# Patient Record
Sex: Male | Born: 1995 | Race: Black or African American | Hispanic: No | Marital: Single | Smoking: Never smoker
Health system: Southern US, Community
[De-identification: ages and names within clinical notes are randomized; demographics above are authoritative.]

## PROBLEM LIST (undated history)

## (undated) DIAGNOSIS — Z993 Dependence on wheelchair: Secondary | ICD-10-CM

## (undated) DIAGNOSIS — K116 Mucocele of salivary gland: Secondary | ICD-10-CM

## (undated) DIAGNOSIS — R6250 Unspecified lack of expected normal physiological development in childhood: Secondary | ICD-10-CM

## (undated) DIAGNOSIS — G809 Cerebral palsy, unspecified: Secondary | ICD-10-CM

## (undated) HISTORY — PX: EYE MUSCLE SURGERY: SHX370

## (undated) HISTORY — PX: OTHER SURGICAL HISTORY: SHX169

## (undated) HISTORY — PX: KNEE SURGERY: SHX244

---
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
## 2017-03-08 ENCOUNTER — Ambulatory Visit: Payer: Medicaid Other | Attending: Physical Medicine and Rehabilitation

## 2017-03-08 DIAGNOSIS — R278 Other lack of coordination: Secondary | ICD-10-CM | POA: Insufficient documentation

## 2017-03-08 NOTE — Therapy (Signed)
[place], [place], [address] Phone: [phone]   Fax:  [phone]  Patient Details  Name: Taylor Fernandez MRN: [id number] Date of Birth: 21-Nov-1995 Referring Provider:  Georga Hacking, MD  Encounter Date: 03/08/2017 [name] and his Mom arrived for his screen today and verbalized that they were interested in vocational training. OT and Therapy supervisor discussed with neuro rehab to confirm they could assist with Taylor Fernandez's needs/requests. Neuro rehab agreed that they could work with him to accomplish his goals. Mom and Taylor Fernandez agreed and they worked with front office staff to schedule an evaluation with neuro rehab.  Agustin Cree MS, OTR/L 03/08/2017, 9:48 AM  [place], [place], [address] Phone: [phone]   Fax:  [phone]

## 2017-03-15 ENCOUNTER — Ambulatory Visit: Payer: Medicaid Other | Attending: Physical Medicine and Rehabilitation | Admitting: Occupational Therapy

## 2017-03-15 DIAGNOSIS — G8113 Spastic hemiplegia affecting right nondominant side: Secondary | ICD-10-CM

## 2017-03-15 DIAGNOSIS — R279 Unspecified lack of coordination: Secondary | ICD-10-CM | POA: Insufficient documentation

## 2017-03-15 DIAGNOSIS — G8112 Spastic hemiplegia affecting left dominant side: Secondary | ICD-10-CM | POA: Diagnosis present

## 2017-03-15 DIAGNOSIS — R278 Other lack of coordination: Secondary | ICD-10-CM

## 2017-03-15 NOTE — Therapy (Signed)
[facility] [address] [place], [place], [address] Phone: [phone]   Fax:  [phone]  Occupational Therapy Evaluation  Patient Details  Name: Taylor Fernandez MRN: [id number] Date of Birth: 08-May-1996 Referring Provider: Dr. Georga Hacking  Encounter Date: 03/15/2017      OT End of Session - 03/15/17 1022    Visit Number 1   Number of Visits 17   Date for OT Re-Evaluation 05/14/17   Authorization Type MCD - awaiting authorization   OT Start Time 0850   OT Stop Time 0935   OT Time Calculation (min) 45 min   Activity Tolerance Patient tolerated treatment well      Past Medical History:  Diagnosis Date  . Cerebral palsy ([place])   . Developmental delay   . Wheelchair bound    Can not stand     Past Surgical History:  Procedure Laterality Date  . EYE MUSCLE SURGERY     21 yrs old and 56-12 yrs old  . KNEE SURGERY     Tendons extended on both legs  . STRABISMUS SURGERY Bilateral 04/03/2015   Procedure: REPAIR STRABISMUS PEDIATRIC;  Surgeon: Everitt Amber, MD;  Location: [facility];  Service: Ophthalmology;  Laterality: Bilateral;    There were no vitals filed for this visit.      Subjective Assessment - 03/15/17 0856    Patient is accompained by: Family member  dad   Pertinent History CP (Spastic), tendon lengthening LE's   Limitations bilateral AFO's   Currently in Pain? No/denies           [facility] OT Assessment - 03/15/17 0001      Assessment   Diagnosis Cerebral Palsy  spastic, quadraplegic type   Referring Provider Dr. Georga Hacking   Onset Date --  congenital   Assessment Pt uses w/c for longer distances and requires assist for all transfers and walking. Pt scissors when walking. Rt side spasticity greater than Lt side. Pt cognitively on a low level elementary grade   Prior Therapy pediatric outpatient therapy     Precautions   Precautions Fall  baclofen pump    Required Braces or Orthoses Other Brace/Splint   Other Brace/Splint bilateral AFO's     Balance Screen   Has the patient fallen in the past 6 months No   Has the patient had a decrease in activity level because of a fear of falling?  No   Is the patient reluctant to leave their home because of a fear of falling?  No     Home  Environment   Bathroom Shower/Tub Tub/Shower [employer]  w/c, motorized w/c, platform walker   Additional Comments Pt lives in 1 story home with level entry   Lives With --  parents     Prior Function   Level of Independence Needs assistance with ADLs;Needs assistance with gait   Vocation Student  about to graduate    Comments In Life Skills class d/t lower level cognition     ADL   Eating/Feeding Needs assist with cutting food  set up   Grooming --  dependent   Upper Body Bathing Minimal assistance   Lower Body Bathing Moderate assistance   Upper Body Dressing Maximal assistance   Lower Body Dressing --  dependent   Toilet Transfer Moderate assistance  with assist bilateral hands   Tub/Shower Transfer Maximal assistance  shower seat long enough to perform like w/ tub bench  ADL comments Pt with max difficulty backing up to sit from standing d/t LE spasticity.      IADL   Meal Prep Able to complete simple cold meal and snack prep  from motorized w/c     Mobility   Mobility Status Needs assist   Mobility Status Comments motorized w/c for kitchen mobility and community (to school).      Written Expression   Dominant Hand Left     Vision - History   Additional Comments eye surgery to correct strabismus     Cognition   Overall Cognitive Status History of cognitive impairments - at baseline     Observation/Other Assessments   Observations Pt can follow simple commands, but slower to process. Pt verbal but dysarthric and slow to process info. Pt unable to detect Rt/Lt discrimination     Coordination   Box and  Blocks TBA following session   Coordination Pt has functional use Lt hand with opposition to 5th digit. Unsure if pt has in hand manipulation - TBA further next session. Rt hand only with gross grasp on top of object (vs. around object d/t lack of thumb abduction)     Tone   Assessment Location Right Upper Extremity;Left Upper Extremity     ROM / Strength   AROM / PROM / Strength AROM     AROM   Overall AROM Comments Overall - pt dominated by synergy pattern RUE especially distally. Pt has approx 90* sh. flex, 75% ER, 25% IR, 75% elbow ext. Pt is in wrist extension with just gross grasp and release.  Thumb opposition to 2nd digit only. LUE: Sh. WFL's, elbow full flex, ext limited approx 15%, full gross composite flex/ext of hand, thumb opposition to 5th digit     RUE Tone   RUE Tone Moderate     LUE Tone   LUE Tone Mild                           OT Short Term Goals - 03/15/17 1031      OT SHORT TERM GOAL #1   Title Pt/family independent with initial HEP (STG's 04/14/17)    Baseline Dependent    Time 4   Period Weeks   Status New     OT SHORT TERM GOAL #2   Title Pt/family independent with splint wear and care prn   Baseline dependent   Time 4   Period Weeks   Status New     OT SHORT TERM GOAL #3   Title Pt/family to verbalize understanding with potential A/E and task modifications to increase ease/independence with ADLS   Baseline dependent    Time 4   Period Weeks   Status New     OT SHORT TERM GOAL #4   Title Pt/family to verbalize understanding with recommended muscles to address medically (botox injections) for spasitcity management and function   Baseline dependent   Time 4   Period Weeks   Status New           OT Long Term Goals - 03/15/17 1036      OT LONG TERM GOAL #1   Title Improve Box & BLocks score LUE to increase functional reaching tasks (LTG's due 05/14/17)    Baseline TBA next session   Time 8   Period Weeks   Status New      OT LONG TERM GOAL #2   Title Pt to perform UE dressing with  mod assist or less   Baseline max assist   Time 8   Period Weeks   Status New     OT LONG TERM GOAL #3   Title Pt to provide min assist for LE dressing using bilateral hands   Baseline dependent   Time 8   Period Weeks   Status New     OT LONG TERM GOAL #4   Title Pt/family to have greater understanding of potential job entry possibilities with patient's physical and cognitive deficits   Baseline dependent   Time 8   Period Weeks   Status New               Plan - 03/15/17 1024    Clinical Impression Statement Pt is a 21 y.o. 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 Pt is graduating high school next week and is interested in vocational pursuits if able. Pt/father also concerned with spasticity increasing now that battery from baclofen pump is low, and wants to avoid surgery to replace. Pt/father also interested in resuming botox injections. Pt would benefit from O.T. for proper stretching/HEP to minimize spasticity, potential splinting needs, botox recommendations, and functional use of UE's as able for potential job entry.  (Pt is already submitted application for Voc Rehab)   Occupational Profile and client history currently impacting functional performance CP with physical and cognitive deficits   Occupational performance deficits (Please refer to evaluation for details): ADL's;IADL's;Work;Social Participation   Rehab Potential Good   Current Impairments/barriers affecting progress: severity of physical and cognitive deficits from CP   OT Frequency 2x / week   OT Duration 8 weeks  plus eval   OT Treatment/Interventions Self-care/ADL training;DME and/or AE instruction;Splinting;Patient/family education;Therapeutic exercises;Therapeutic activities;Neuromuscular education;Functional Mobility Training;Passive range of motion;Cognitive  remediation/compensation;Manual Therapy;Electrical Stimulation   Plan Further assess coordination including Box & Blocks both hands if able, in hand manipulation Lt hand, and assess grip strength and writing. Begin splinting for Rt hand (for Rt thumb abduction)   Clinical Decision Making Several treatment options, min-mod task modification necessary      Patient will benefit from skilled therapeutic intervention in order to improve the following deficits and impairments:  Decreased coordination, Impaired flexibility, Improper body mechanics, Improper spinal/pelvic alignment, Decreased safety awareness, Impaired tone, Impaired UE functional use, Decreased mobility, Decreased cognition, Increased muscle spasms, Decreased knowledge of use of DME  Visit Diagnosis: Spastic hemiplegia affecting right nondominant side, unspecified etiology ([place]) - Plan: Ot plan of care cert/re-cert  Spastic hemiplegia affecting left dominant side, unspecified etiology ([place]) - Plan: Ot plan of care cert/re-cert  Other lack of coordination - Plan: Ot plan of care cert/re-cert    Problem List There are no active problems to display for this patient.   Carey Bullocks, OTR/L 03/15/2017, 10:56 AM  [facility] [address] [place], [place], [address] Phone: [phone]   Fax:  [phone]  Name: FREDDIE DYMEK MRN: [id number] Date of Birth: 1996/05/08

## 2017-04-04 ENCOUNTER — Ambulatory Visit: Payer: Medicaid Other | Admitting: Occupational Therapy

## 2017-04-04 DIAGNOSIS — R278 Other lack of coordination: Secondary | ICD-10-CM

## 2017-04-04 DIAGNOSIS — G8113 Spastic hemiplegia affecting right nondominant side: Secondary | ICD-10-CM

## 2017-04-04 NOTE — Therapy (Signed)
[facility] [address] [place], [place], [address] Phone: [phone]   Fax:  [phone]  Occupational Therapy Treatment  Patient Details  Name: Taylor Fernandez MRN: [id number] Date of Birth: 09-09-1996 Referring Provider: Dr. Georga Hacking  Encounter Date: 04/04/2017      OT End of Session - 04/04/17 0910    Visit Number 2   Number of Visits 17   Date for OT Re-Evaluation 05/14/17   Authorization Type MCD   Authorization Time Period approved 04/04/17 - 05/29/17 for 16 visits   Authorization - Visit Number 1   Authorization - Number of Visits 16   OT Start Time 0810   OT Stop Time 0850   OT Time Calculation (min) 40 min   Activity Tolerance Patient tolerated treatment well      Past Medical History:  Diagnosis Date  . Cerebral palsy ([place])   . Developmental delay   . Wheelchair bound    Can not stand     Past Surgical History:  Procedure Laterality Date  . EYE MUSCLE SURGERY     21 yrs old and 18-12 yrs old  . KNEE SURGERY     Tendons extended on both legs  . STRABISMUS SURGERY Bilateral 04/03/2015   Procedure: REPAIR STRABISMUS PEDIATRIC;  Surgeon: Everitt Amber, MD;  Location: [facility];  Service: Ophthalmology;  Laterality: Bilateral;    There were no vitals filed for this visit.      Subjective Assessment - 04/04/17 0814    Subjective  He has an old splint I can bring in next time to show you (per mom)   Patient is accompained by: Family member  mom   Pertinent History CP (Spastic), tendon lengthening LE's   Limitations bilateral AFO's   Currently in Pain? No/denies            [facility] OT Assessment - 04/04/17 0001      Assessment   Assessment Further assessed function and coordination in both hands and writing - see below     Coordination   Box and Blocks Rt = unable, Lt = 13 (slower partly d/t cognitive status)    Other Pt able to print name and simple sentence  with Lt hand but requires extra time, and does not always space b/t words.    Coordination Pt can pick up and rotate highlighter between fingers, and manipulate checkers (up to 3) from fingertips to/from palm Lt hand.      Hand Function   Right Hand Grip (lbs) Avg 40 lbs (but difficult to determine true strength d/t spasticity)   Left Hand Grip (lbs) Avg 50 lbs (40, 60, 50 lbs)   only mild hypertonia Lt hand - does not limit function                  OT Treatments/Exercises (OP) - 04/04/17 0001      ADLs   ADL Comments Further assessed function - see assessment. Also discussed splinting options with fellow O.T. as she will see pt next session     Splinting   Splinting Began thumb abduction splint for Rt hand which places thumb MP joint in better functional position, however IP joint still hyperextends significantly when attempting to grasp item (d/t spasticity), and splint is difficult to don/doff. May benefit better from more dorsal blocking splint for thumb to block IP joint as well and for greater ease with donning/doffing, OR order pre-fab splint (similar to previous splint for  Rt hand - pt to bring in next session)                   OT Short Term Goals - 03/15/17 1031      OT SHORT TERM GOAL #1   Title Pt/family independent with initial HEP (STG's 04/14/17)    Baseline Dependent    Time 4   Period Weeks   Status New     OT SHORT TERM GOAL #2   Title Pt/family independent with splint wear and care prn   Baseline dependent   Time 4   Period Weeks   Status New     OT SHORT TERM GOAL #3   Title Pt/family to verbalize understanding with potential A/E and task modifications to increase ease/independence with ADLS   Baseline dependent    Time 4   Period Weeks   Status New     OT SHORT TERM GOAL #4   Title Pt/family to verbalize understanding with recommended muscles to address medically (botox injections) for spasitcity management and function   Baseline  dependent   Time 4   Period Weeks   Status New           OT Long Term Goals - 04/04/17 0915      OT LONG TERM GOAL #1   Title Improve Box & BLocks score LUE by 3 blocks or greater to increase functional reaching tasks (LTG's due 05/14/17)    Baseline 13   Time 8   Period Weeks   Status Revised     OT LONG TERM GOAL #2   Title Pt to perform UE dressing with mod assist or less   Baseline max assist   Time 8   Period Weeks   Status New     OT LONG TERM GOAL #3   Title Pt to provide min assist for LE dressing using bilateral hands   Baseline dependent   Time 8   Period Weeks   Status New     OT LONG TERM GOAL #4   Title Pt/family to have greater understanding of potential job entry possibilities with patient's physical and cognitive deficits   Baseline dependent   Time 8   Period Weeks   Status New               Plan - 04/04/17 2505    Clinical Impression Statement Further assessed function and coordination both hands: pt able to perform in hand manipulation Lt hand. Pt unable to do Box & Blocks Rt hand.    Rehab Potential Good   OT Treatment/Interventions Self-care/ADL training;DME and/or AE instruction;Splinting;Patient/family education;Therapeutic exercises;Therapeutic activities;Neuromuscular education;Functional Mobility Training;Passive range of motion;Cognitive remediation/compensation;Manual Therapy;Electrical Stimulation   Plan continue to explore splinting options for Rt thumb (for better positioning) - see treatment section for details. If time allows, begin self assisted or P/ROM HEP for RUE as appropriate   Consulted and Agree with Plan of Care Patient      Patient will benefit from skilled therapeutic intervention in order to improve the following deficits and impairments:  Decreased coordination, Impaired flexibility, Improper body mechanics, Improper spinal/pelvic alignment, Decreased safety awareness, Impaired tone, Impaired UE functional use,  Decreased mobility, Decreased cognition, Increased muscle spasms, Decreased knowledge of use of DME  Visit Diagnosis: Spastic hemiplegia affecting right nondominant side, unspecified etiology ([place])  Other lack of coordination    Problem List There are no active problems to display for this patient.   Carey Bullocks, OTR/L 04/04/2017, 9:17  AM  [facility] [address] [place], [place], [address] Phone: [phone]   Fax:  [phone]  Name: Taylor Fernandez MRN: [id number] Date of Birth: Oct 07, 1996

## 2017-04-06 ENCOUNTER — Encounter: Payer: Medicaid Other | Admitting: Occupational Therapy

## 2017-04-07 ENCOUNTER — Ambulatory Visit: Payer: Medicaid Other | Admitting: Occupational Therapy

## 2017-04-07 DIAGNOSIS — G8113 Spastic hemiplegia affecting right nondominant side: Secondary | ICD-10-CM

## 2017-04-07 DIAGNOSIS — R279 Unspecified lack of coordination: Secondary | ICD-10-CM

## 2017-04-07 DIAGNOSIS — R278 Other lack of coordination: Secondary | ICD-10-CM

## 2017-04-07 DIAGNOSIS — G8112 Spastic hemiplegia affecting left dominant side: Secondary | ICD-10-CM

## 2017-04-07 NOTE — Therapy (Signed)
[facility] [address] [place], [place], [address] Phone: [phone]   Fax:  [phone]  Occupational Therapy Treatment  Patient Details  Name: Taylor Fernandez MRN: [id number] Date of Birth: 1995/12/15 Referring Provider: Dr. Georga Hacking  Encounter Date: 04/07/2017      OT End of Session - 04/07/17 1621    Visit Number 3   Number of Visits 17   Date for OT Re-Evaluation 05/14/17   Authorization Type MCD   Authorization Time Period approved 04/04/17 - 05/29/17 for 16 visits   Authorization - Visit Number 2   Authorization - Number of Visits 16   OT Start Time 0850   OT Stop Time 0930   OT Time Calculation (min) 40 min      Past Medical History:  Diagnosis Date  . Cerebral palsy ([place])   . Developmental delay   . Wheelchair bound    Can not stand     Past Surgical History:  Procedure Laterality Date  . EYE MUSCLE SURGERY     21 yrs old and [age] yrs old  . KNEE SURGERY     Tendons extended on both legs  . STRABISMUS SURGERY Bilateral 04/03/2015   Procedure: REPAIR STRABISMUS PEDIATRIC;  Surgeon: Everitt Amber, MD;  Location: [facility];  Service: Ophthalmology;  Laterality: Bilateral;    There were no vitals filed for this visit.      Subjective Assessment - 04/07/17 1631    Patient is accompained by: Family member  mom   Pertinent History CP (Spastic), tendon lengthening LE's   Limitations bilateral AFO's   Currently in Pain? No/denies           Therapist took measurements and assessed prefab splinting needs with pt's mother. Therapistt to hold off on issue of hard custom splint at this time. Functional reach and grasp release with LUE, min v.c Pt was able to grasp release a 1 inch block with RUE with mod faciliation Handwriting activities with pt writing his name and to trace capital letters using foam grip, min v.c. Pt demonstrates good  performance                      OT Short Term Goals - 03/15/17 1031      OT SHORT TERM GOAL #1   Title Pt/family independent with initial HEP (STG's 04/14/17)    Baseline Dependent    Time 4   Period Weeks   Status New     OT SHORT TERM GOAL #2   Title Pt/family independent with splint wear and care prn   Baseline dependent   Time 4   Period Weeks   Status New     OT SHORT TERM GOAL #3   Title Pt/family to verbalize understanding with potential A/E and task modifications to increase ease/independence with ADLS   Baseline dependent    Time 4   Period Weeks   Status New     OT SHORT TERM GOAL #4   Title Pt/family to verbalize understanding with recommended muscles to address medically (botox injections) for spasitcity management and function   Baseline dependent   Time 4   Period Weeks   Status New           OT Long Term Goals - 04/04/17 0915      OT LONG TERM GOAL #1   Title Improve Box & BLocks score LUE by 3 blocks or greater to increase functional reaching  tasks (LTG's due 05/14/17)    Baseline 13   Time 8   Period Weeks   Status Revised     OT LONG TERM GOAL #2   Title Pt to perform UE dressing with mod assist or less   Baseline max assist   Time 8   Period Weeks   Status New     OT LONG TERM GOAL #3   Title Pt to provide min assist for LE dressing using bilateral hands   Baseline dependent   Time 8   Period Weeks   Status New     OT LONG TERM GOAL #4   Title Pt/family to have greater understanding of potential job entry possibilities with patient's physical and cognitive deficits   Baseline dependent   Time 8   Period Weeks   Status New               Plan - 04/07/17 1621    Clinical Impression Statement Therapist discussed splinting options with pt/ and mother. Pt's mother was unable to locate pt's previous prefab splint. Therapsit to order prefab splint for pt trial.   Rehab Potential Good   Current  Impairments/barriers affecting progress: severity of physical and cognitive deficits from CP   OT Frequency 2x / week   OT Duration 8 weeks   OT Treatment/Interventions Self-care/ADL training;DME and/or AE instruction;Splinting;Patient/family education;Therapeutic exercises;Therapeutic activities;Neuromuscular education;Functional Mobility Training;Passive range of motion;Cognitive remediation/compensation;Manual Therapy;Electrical Stimulation   Plan P/ROM HEP for RUE, functional use of UE's   Consulted and Agree with Plan of Care Patient;Family member/caregiver   Family Member Consulted mother      Patient will benefit from skilled therapeutic intervention in order to improve the following deficits and impairments:  Decreased coordination, Impaired flexibility, Improper body mechanics, Improper spinal/pelvic alignment, Decreased safety awareness, Impaired tone, Impaired UE functional use, Decreased mobility, Decreased cognition, Increased muscle spasms, Decreased knowledge of use of DME  Visit Diagnosis: Spastic hemiplegia affecting right nondominant side, unspecified etiology ([facility])  Other lack of coordination  Lack of coordination  Spastic hemiplegia affecting left dominant side, unspecified etiology ([place])    Problem List There are no active problems to display for this patient.   RINE,KATHRYN 04/07/2017, 4:31 PM  [facility] [address] [place], [place], [address] Phone: [phone]   Fax:  [phone]  Name: DESIDERIO DOLATA MRN: [id number] Date of Birth: 05-Nov-1995

## 2017-04-11 ENCOUNTER — Ambulatory Visit: Payer: Medicaid Other | Attending: Physical Medicine and Rehabilitation | Admitting: Occupational Therapy

## 2017-04-11 DIAGNOSIS — G8113 Spastic hemiplegia affecting right nondominant side: Secondary | ICD-10-CM | POA: Insufficient documentation

## 2017-04-11 DIAGNOSIS — G8112 Spastic hemiplegia affecting left dominant side: Secondary | ICD-10-CM | POA: Diagnosis present

## 2017-04-11 DIAGNOSIS — R279 Unspecified lack of coordination: Secondary | ICD-10-CM | POA: Diagnosis present

## 2017-04-11 DIAGNOSIS — R278 Other lack of coordination: Secondary | ICD-10-CM | POA: Insufficient documentation

## 2017-04-11 NOTE — Therapy (Signed)
[facility] [address] [place], [place], [address] Phone: [phone]   Fax:  [phone]  Occupational Therapy Treatment  Patient Details  Name: Taylor Fernandez MRN: [id number] Date of Birth: 1996/06/05 Referring Provider: Dr. Georga Hacking  Encounter Date: 04/11/2017      OT End of Session - 04/11/17 1706    Visit Number 4   Date for OT Re-Evaluation 05/14/17   Authorization Type MCD   Authorization Time Period approved 04/04/17 - 05/29/17 for 16 visits   Authorization - Visit Number 3   Authorization - Number of Visits 16   OT Start Time 0807   OT Stop Time 0845   OT Time Calculation (min) 38 min      Past Medical History:  Diagnosis Date  . Cerebral palsy ([place])   . Developmental delay   . Wheelchair bound    Can not stand     Past Surgical History:  Procedure Laterality Date  . EYE MUSCLE SURGERY     21 yrs old and 19-10 yrs old  . KNEE SURGERY     Tendons extended on both legs  . STRABISMUS SURGERY Bilateral 04/03/2015   Procedure: REPAIR STRABISMUS PEDIATRIC;  Surgeon: Everitt Amber, MD;  Location: [facility];  Service: Ophthalmology;  Laterality: Bilateral;    There were no vitals filed for this visit.      Subjective Assessment - 04/11/17 1707    Subjective  Denies pain   Pertinent History CP (Spastic), tendon lengthening LE's   Limitations bilateral AFO's   Currently in Pain? No/denies             Treatment: Pt transferred stand pivot with 2 person assist,(pt performing 70%) for safety. Prone on elbows over physioball while lifting  chest and performing functional reach with LUE to place checkers in connect four frame, min-mod facilitation(max a +2 to get positioned over physioball) Pt rolled over on his back with mod -max A then he transitioned to seated mod A.                    OT Short Term Goals - 03/15/17 1031      OT SHORT TERM GOAL #1    Title Pt/family independent with initial HEP (STG's 04/14/17)    Baseline Dependent    Time 4   Period Weeks   Status New     OT SHORT TERM GOAL #2   Title Pt/family independent with splint wear and care prn   Baseline dependent   Time 4   Period Weeks   Status New     OT SHORT TERM GOAL #3   Title Pt/family to verbalize understanding with potential A/E and task modifications to increase ease/independence with ADLS   Baseline dependent    Time 4   Period Weeks   Status New     OT SHORT TERM GOAL #4   Title Pt/family to verbalize understanding with recommended muscles to address medically (botox injections) for spasitcity management and function   Baseline dependent   Time 4   Period Weeks   Status New           OT Long Term Goals - 04/04/17 0915      OT LONG TERM GOAL #1   Title Improve Box & BLocks score LUE by 3 blocks or greater to increase functional reaching tasks (LTG's due 05/14/17)    Baseline 13   Time 8   Period Weeks  Status Revised     OT LONG TERM GOAL #2   Title Pt to perform UE dressing with mod assist or less   Baseline max assist   Time 8   Period Weeks   Status New     OT LONG TERM GOAL #3   Title Pt to provide min assist for LE dressing using bilateral hands   Baseline dependent   Time 8   Period Weeks   Status New     OT LONG TERM GOAL #4   Title Pt/family to have greater understanding of potential job entry possibilities with patient's physical and cognitive deficits   Baseline dependent   Time 8   Period Weeks   Status New               Plan - 04/11/17 1706    Clinical Impression Statement Pt is progressing towards goals for UE functional use.   Rehab Potential Good   Current Impairments/barriers affecting progress: severity of physical and cognitive deficits from CP   OT Frequency 2x / week   OT Duration 8 weeks   OT Treatment/Interventions Self-care/ADL training;DME and/or AE instruction;Splinting;Patient/family  education;Therapeutic exercises;Therapeutic activities;Neuromuscular education;Functional Mobility Training;Passive range of motion;Cognitive remediation/compensation;Manual Therapy;Electrical Stimulation   Plan functional use of UE's, spasticity management   Consulted and Agree with Plan of Care Patient;Family member/caregiver   Family Member Consulted mother      Patient will benefit from skilled therapeutic intervention in order to improve the following deficits and impairments:  Decreased coordination, Impaired flexibility, Improper body mechanics, Improper spinal/pelvic alignment, Decreased safety awareness, Impaired tone, Impaired UE functional use, Decreased mobility, Decreased cognition, Increased muscle spasms, Decreased knowledge of use of DME  Visit Diagnosis: Spastic hemiplegia affecting right nondominant side, unspecified etiology (HCC)  Other lack of coordination  Lack of coordination  Spastic hemiplegia affecting left dominant side, unspecified etiology ([place])    Problem List There are no active problems to display for this patient.   Tallin Hart 04/11/2017, 5:13 PM Theone Murdoch, OTR/L [phone] Phone: [phone] 5:13 PM 04/11/17 [facility] [address] [place], [place], [address] Phone: [phone]   Fax:  [phone]  Name: Taylor Fernandez MRN: [id number] Date of Birth: Apr 05, 1996

## 2017-04-13 ENCOUNTER — Ambulatory Visit: Payer: Medicaid Other | Admitting: Occupational Therapy

## 2017-04-13 ENCOUNTER — Encounter: Payer: Self-pay | Admitting: Occupational Therapy

## 2017-04-13 DIAGNOSIS — R278 Other lack of coordination: Secondary | ICD-10-CM

## 2017-04-13 DIAGNOSIS — G8112 Spastic hemiplegia affecting left dominant side: Secondary | ICD-10-CM

## 2017-04-13 DIAGNOSIS — G8113 Spastic hemiplegia affecting right nondominant side: Secondary | ICD-10-CM | POA: Diagnosis not present

## 2017-04-13 NOTE — Therapy (Signed)
[facility] [address] [place], [place], [address] Phone: [phone]   Fax:  [phone]  Occupational Therapy Treatment  Patient Details  Name: Taylor Fernandez MRN: [id number] Date of Birth: 08-20-1996 Referring Provider: Dr. Georga Hacking  Encounter Date: 04/13/2017      OT End of Session - 04/13/17 1002    Visit Number 5   Number of Visits 17   Date for OT Re-Evaluation 05/14/17   Authorization Type MCD   Authorization Time Period approved 04/04/17 - 05/29/17 for 16 visits   Authorization - Visit Number 5   Authorization - Number of Visits 16   OT Start Time [phone]  pt arrived late   OT Stop Time 0932   OT Time Calculation (min) 40 min      Past Medical History:  Diagnosis Date  . Cerebral palsy ([place])   . Developmental delay   . Wheelchair bound    Can not stand     Past Surgical History:  Procedure Laterality Date  . EYE MUSCLE SURGERY     21 yrs old and 10-10 yrs old  . KNEE SURGERY     Tendons extended on both legs  . STRABISMUS SURGERY Bilateral 04/03/2015   Procedure: REPAIR STRABISMUS PEDIATRIC;  Surgeon: Everitt Amber, MD;  Location: [facility];  Service: Ophthalmology;  Laterality: Bilateral;    There were no vitals filed for this visit.      Subjective Assessment - 04/13/17 0854    Subjective  I like to do exercises    Patient is accompained by: Family member  mom   Pertinent History CP (Spastic), tendon lengthening LE's   Limitations bilateral AFO's   Currently in Pain? No/denies                      OT Treatments/Exercises (OP) - 04/13/17 0001      Neurological Re-education Exercises   Other Exercises 1 Neuro re ed to address development of HEP - discussed with mom positioning on back with bolster under LE's to reduce reflexive response and facilitate lower tone.  Demonstrated LE flexion with lower trunk intiiated slow rotation to decrease tone  prior to addressing UE's.  Pt then able to use bar to do chest presses and then overhead reach with min a for RUE.  Pt completed 15 reps x2 for each exercise.  Progressed to open chain reaching for small ball in PNF and overhead patterns with each UE separately.  Mom needed to leave duirng session therefore will instruct mom next session for HEP.  Mom in agreement. Mom also requesting info on how to obtain bolster, bench for kneeling and elongaged therapy ball. Will provide resource next session.                    OT Short Term Goals - 04/13/17 1001      OT SHORT TERM GOAL #1   Title Pt/family independent with initial HEP (STG's 04/14/17)    Baseline Dependent    Time 4   Period Weeks   Status New     OT SHORT TERM GOAL #2   Title Pt/family independent with splint wear and care prn   Baseline dependent   Time 4   Period Weeks   Status New     OT SHORT TERM GOAL #3   Title Pt/family to verbalize understanding with potential A/E and task modifications to increase ease/independence with ADLS   Baseline  dependent    Time 4   Period Weeks   Status New     OT SHORT TERM GOAL #4   Title Pt/family to verbalize understanding with recommended muscles to address medically (botox injections) for spasitcity management and function   Baseline dependent   Time 4   Period Weeks   Status New           OT Long Term Goals - 04/13/17 1001      OT LONG TERM GOAL #1   Title Improve Box & BLocks score LUE by 3 blocks or greater to increase functional reaching tasks (LTG's due 05/14/17)    Baseline 13   Time 8   Period Weeks   Status Revised     OT LONG TERM GOAL #2   Title Pt to perform UE dressing with mod assist or less   Baseline max assist   Time 8   Period Weeks   Status New     OT LONG TERM GOAL #3   Title Pt to provide min assist for LE dressing using bilateral hands   Baseline dependent   Time 8   Period Weeks   Status New     OT LONG TERM GOAL #4   Title  Pt/family to have greater understanding of potential job entry possibilities with patient's physical and cognitive deficits   Baseline dependent   Time 8   Period Weeks   Status New               Plan - 04/13/17 1001    Clinical Impression Statement Pt progressing toward goals. Will instruct mom in active HEP next session.    Rehab Potential Good   Current Impairments/barriers affecting progress: severity of physical and cognitive deficits from CP   OT Frequency 2x / week   OT Duration 8 weeks   OT Treatment/Interventions Self-care/ADL training;DME and/or AE instruction;Splinting;Patient/family education;Therapeutic exercises;Therapeutic activities;Neuromuscular education;Functional Mobility Training;Passive range of motion;Cognitive remediation/compensation;Manual Therapy;Printmaker   Plan instruct mom in HEP, provide resource for equipment, functional use of UE's, spasticity mgmt.   Consulted and Agree with Plan of Care Patient;Family member/caregiver   Family Member Consulted mother part of session      Patient will benefit from skilled therapeutic intervention in order to improve the following deficits and impairments:  Decreased coordination, Impaired flexibility, Improper body mechanics, Improper spinal/pelvic alignment, Decreased safety awareness, Impaired tone, Impaired UE functional use, Decreased mobility, Decreased cognition, Increased muscle spasms, Decreased knowledge of use of DME  Visit Diagnosis: Spastic hemiplegia affecting right nondominant side, unspecified etiology (HCC)  Other lack of coordination  Spastic hemiplegia affecting left dominant side, unspecified etiology ([place])    Problem List There are no active problems to display for this patient.   Quay Burow, OTR/L 04/13/2017, 10:04 AM  [facility] [address] [place], [place], [address] Phone: [phone]   Fax:   [phone]  Name: Taylor Fernandez MRN: [id number] Date of Birth: September 28, 1996

## 2017-04-18 ENCOUNTER — Ambulatory Visit: Payer: Medicaid Other | Admitting: Occupational Therapy

## 2017-04-20 ENCOUNTER — Ambulatory Visit: Payer: Medicaid Other | Admitting: Occupational Therapy

## 2017-04-20 DIAGNOSIS — G8112 Spastic hemiplegia affecting left dominant side: Secondary | ICD-10-CM

## 2017-04-20 DIAGNOSIS — G8113 Spastic hemiplegia affecting right nondominant side: Secondary | ICD-10-CM | POA: Diagnosis not present

## 2017-04-20 NOTE — Therapy (Signed)
[facility] [address] [place], [place], [address] Phone: [phone]   Fax:  [phone]  Occupational Therapy Treatment  Patient Details  Name: Taylor Fernandez MRN: [id number] Date of Birth: June 22, 1996 Referring Provider: Dr. Georga Hacking  Encounter Date: 04/20/2017      OT End of Session - 04/20/17 1129    Visit Number 6   Number of Visits 17   Date for OT Re-Evaluation 05/14/17   Authorization Type MCD   Authorization Time Period approved 04/04/17 - 05/29/17 for 16 visits   Authorization - Visit Number 6   Authorization - Number of Visits 16   OT Start Time 0805   OT Stop Time 0850   OT Time Calculation (min) 45 min   Activity Tolerance Patient tolerated treatment well      Past Medical History:  Diagnosis Date  . Cerebral palsy ([place])   . Developmental delay   . Wheelchair bound    Can not stand     Past Surgical History:  Procedure Laterality Date  . EYE MUSCLE SURGERY     21 yrs old and 52-12 yrs old  . KNEE SURGERY     Tendons extended on both legs  . STRABISMUS SURGERY Bilateral 04/03/2015   Procedure: REPAIR STRABISMUS PEDIATRIC;  Surgeon: Everitt Amber, MD;  Location: [facility];  Service: Ophthalmology;  Laterality: Bilateral;    There were no vitals filed for this visit.      Subjective Assessment - 04/20/17 0817    Subjective  I'd rather you just work with him now (per mom - vs. getting pictures of requested items)    Patient is accompained by: Family member  mom   Pertinent History CP (Spastic), tendon lengthening LE's   Limitations bilateral AFO's   Currently in Pain? No/denies                      OT Treatments/Exercises (OP) - 04/20/17 0001      ADLs   ADL Comments Mom did not want therapist to make copies of requested items from previous session during today's session. She wished to start him on an ex program     Neurological Re-education  Exercises   Other Exercises 1 Supine: with bolster under LE's to reduce reflexive response and decr. tone LE's: began with stretches/trunk rotation prior to ex's: Pt then performed chest press, sh. flex/ext overhead reach, and sh. abd/add with slight elbow extension stretch as able using dowel. Will formally issue HEP next session d/t time constraints (mother was only present at beginning of session for splint assessment, and last 5 min. of session to review HEP)     Splinting   Splinting Assessed 2 different pre-fab splints for thumb abduction Rt hand as order came in: pt preferred one over the other (TK thumb splint) and was easier to don/doff therefore issued. Will keep other pre-fab on hold if pt/mother wish to have as well.                 OT Education - 04/20/17 1129    Education provided Yes   Education Details HEP   Person(s) Educated Patient;Parent(s)   Methods Explanation;Demonstration;Verbal cues  HANDOUT to be provided next session d/t time constraints   Comprehension Verbalized understanding;Returned demonstration;Need further instruction;Verbal cues required          OT Short Term Goals - 04/20/17 1130      OT SHORT TERM GOAL #1  Title Pt/family independent with initial HEP (STG's 04/14/17)    Baseline Dependent    Time 4   Period Weeks   Status On-going     OT SHORT TERM GOAL #2   Title Pt/family independent with splint wear and care prn   Baseline dependent   Time 4   Period Weeks   Status On-going     OT SHORT TERM GOAL #3   Title Pt/family to verbalize understanding with potential A/E and task modifications to increase ease/independence with ADLS   Baseline dependent    Time 4   Period Weeks   Status New     OT SHORT TERM GOAL #4   Title Pt/family to verbalize understanding with recommended muscles to address medically (botox injections) for spasitcity management and function   Baseline dependent   Time 4   Period Weeks   Status New            OT Long Term Goals - 04/13/17 1001      OT LONG TERM GOAL #1   Title Improve Box & BLocks score LUE by 3 blocks or greater to increase functional reaching tasks (LTG's due 05/14/17)    Baseline 13   Time 8   Period Weeks   Status Revised     OT LONG TERM GOAL #2   Title Pt to perform UE dressing with mod assist or less   Baseline max assist   Time 8   Period Weeks   Status New     OT LONG TERM GOAL #3   Title Pt to provide min assist for LE dressing using bilateral hands   Baseline dependent   Time 8   Period Weeks   Status New     OT LONG TERM GOAL #4   Title Pt/family to have greater understanding of potential job entry possibilities with patient's physical and cognitive deficits   Baseline dependent   Time 8   Period Weeks   Status New               Plan - 04/20/17 1132    Clinical Impression Statement Pt progressing towards STG's. Pt did well with pre-fab thumb abduction splint today   Rehab Potential Good   Current Impairments/barriers affecting progress: severity of physical and cognitive deficits from CP   OT Frequency 2x / week   OT Duration 8 weeks   OT Treatment/Interventions Self-care/ADL training;DME and/or AE instruction;Splinting;Patient/family education;Therapeutic exercises;Therapeutic activities;Neuromuscular education;Functional Mobility Training;Passive range of motion;Cognitive remediation/compensation;Manual Therapy;Electrical Stimulation   Plan formally issue and review HEP, issue handouts on requested equipment, functional use of UE's, spasticity mngmt   Consulted and Agree with Plan of Care Patient;Family member/caregiver   Family Member Consulted mother part of session      Patient will benefit from skilled therapeutic intervention in order to improve the following deficits and impairments:  Decreased coordination, Impaired flexibility, Improper body mechanics, Improper spinal/pelvic alignment, Decreased safety awareness, Impaired tone,  Impaired UE functional use, Decreased mobility, Decreased cognition, Increased muscle spasms, Decreased knowledge of use of DME  Visit Diagnosis: Spastic hemiplegia affecting right nondominant side, unspecified etiology (HCC)  Spastic hemiplegia affecting left dominant side, unspecified etiology ([place])    Problem List There are no active problems to display for this patient.   Carey Bullocks, OTR/L 04/20/2017, 11:35 AM  [facility] [address] [place], [place], [address] Phone: [phone]   Fax:  [phone]  Name: Taylor Fernandez MRN: [id number] Date  of Birth: 04/07/1996

## 2017-04-20 NOTE — Patient Instructions (Signed)
  BEGIN BY GENTLY STRETCHING Ja[place] WITH UPPER TRUNK ROTATION AND REACHING ACROSS BODY WITH ARM (EACH SIDE), WHILE KEEPING HIPS FROM ROTATING WITH BODY.   1. ELBOW: Extension / Chest Press (Frame)    Lie on back with knees bent under pillows or bolster. Straighten elbows to raise stick/dowel. Hold _2__ seconds.  _10-15__ reps per set, 2 sets per day   2. ROM: Flexion - Wand (Supine)    Lie on back holding wand. Raise arms over head.  Repeat __10-15__ times per set. Do _1___ sets per session. Do __2__ sessions per day.    3. Cane Horizontal - Supine    With straight arms holding stick/dowel above shoulders, bring out to right, center, out to left, and back to above head. Place pillow under Rt upper arm for support and to decrease spasticity Repeat _10-15__ times. Do _2__ times per day.

## 2017-04-25 ENCOUNTER — Ambulatory Visit: Payer: Medicaid Other | Admitting: Occupational Therapy

## 2017-04-25 DIAGNOSIS — G8113 Spastic hemiplegia affecting right nondominant side: Secondary | ICD-10-CM | POA: Diagnosis not present

## 2017-04-25 DIAGNOSIS — G8112 Spastic hemiplegia affecting left dominant side: Secondary | ICD-10-CM

## 2017-04-25 DIAGNOSIS — R278 Other lack of coordination: Secondary | ICD-10-CM

## 2017-04-25 NOTE — Therapy (Signed)
[facility] [address] [place], [place], [address] Phone: [phone]   Fax:  [phone]  Occupational Therapy Treatment  Patient Details  Name: Taylor Fernandez MRN: [id number] Date of Birth: November 17, 1995 Referring Provider: Dr. Georga Hacking  Encounter Date: 04/25/2017      OT End of Session - 04/25/17 1455    Visit Number 7   Number of Visits 17   Date for OT Re-Evaluation 05/14/17   Authorization Type MCD   Authorization Time Period approved 04/04/17 - 05/29/17 for 16 visits   Authorization - Visit Number 7   Authorization - Number of Visits 16   OT Start Time 0805   OT Stop Time 0850   OT Time Calculation (min) 45 min   Activity Tolerance Patient tolerated treatment well      Past Medical History:  Diagnosis Date  . Cerebral palsy ([place])   . Developmental delay   . Wheelchair bound    Can not stand     Past Surgical History:  Procedure Laterality Date  . EYE MUSCLE SURGERY     21 yrs old and 60-10 yrs old  . KNEE SURGERY     Tendons extended on both legs  . STRABISMUS SURGERY Bilateral 04/03/2015   Procedure: REPAIR STRABISMUS PEDIATRIC;  Surgeon: Everitt Amber, MD;  Location: [facility];  Service: Ophthalmology;  Laterality: Bilateral;    There were no vitals filed for this visit.      Subjective Assessment - 04/25/17 1449    Subjective  I think the velcro converters may work for him   Patient is accompained by: Family member  MOM   Pertinent History CP (Spastic), tendon lengthening LE's   Limitations bilateral AFO's   Currently in Pain? No/denies                      OT Treatments/Exercises (OP) - 04/25/17 0001      ADLs   ADL Comments Began assessing STG's and addressing potential A/E needs and/or task modifications for greater ease with ADLS. Explored rocker knife and options for shoes including velcro converters. No other A/E needs identified that pt  could use. Pt already has modified cup and plate for eating. Mother reports he has no trouble using eating utensils. Also pursuing electric toothbrush for more thoroughness. Also issued handouts and discussed how/where to acquire requested treatment items (bolster, kaye bench, peanut physioball) if mother wishes to pursue further.      Neurological Re-education Exercises   Other Exercises 1 Reviewed and formally issued HEP for BUE's                  OT Short Term Goals - 04/25/17 1456      OT SHORT TERM GOAL #1   Title Pt/family independent with initial HEP (STG's 04/14/17)    Baseline Dependent    Time 4   Period Weeks   Status Achieved     OT SHORT TERM GOAL #2   Title Pt/family independent with splint wear and care prn   Baseline dependent   Time 4   Period Weeks   Status Achieved     OT SHORT TERM GOAL #3   Title Pt/family to verbalize understanding with potential A/E and task modifications to increase ease/independence with ADLS   Baseline dependent    Time 4   Period Weeks   Status On-going     OT SHORT TERM GOAL #4   Title Pt/family  to verbalize understanding with recommended muscles to address medically (botox injections) for spasitcity management and function   Baseline dependent   Time 4   Period Weeks   Status Achieved           OT Long Term Goals - 04/25/17 1457      OT LONG TERM GOAL #1   Title Improve Box & BLocks score LUE by 3 blocks or greater to increase functional reaching tasks (LTG's due 05/14/17)    Baseline 13   Time 8   Period Weeks   Status Revised     OT LONG TERM GOAL #2   Title Pt to perform UE dressing with mod assist or less   Baseline max assist   Time 8   Period Weeks   Status New     OT LONG TERM GOAL #3   Title Pt to provide min assist for LE dressing using bilateral hands   Baseline dependent   Time 8   Period Weeks   Status New     OT LONG TERM GOAL #4   Title Pt/family to have greater understanding of  potential job entry possibilities with patient's physical and cognitive deficits   Baseline dependent   Time 8   Period Weeks   Status Deferred  Mother reports pt not a candidate for Voc Rehab at this time               Plan - 04/25/17 1458    Clinical Impression Statement Pt met 3/4 STG's and approximating remaining goal.    Rehab Potential Good   Current Impairments/barriers affecting progress: severity of physical and cognitive deficits from CP   OT Frequency 2x / week   OT Duration 8 weeks   OT Treatment/Interventions Self-care/ADL training;DME and/or AE instruction;Splinting;Patient/family education;Therapeutic exercises;Therapeutic activities;Neuromuscular education;Functional Mobility Training;Passive range of motion;Cognitive remediation/compensation;Manual Therapy;Technical sales engineer use of rocker knife, practice hooking/unhooking velcro (in prep for potential use of velcro converters), functional use of LUE   Consulted and Agree with Plan of Care Patient;Family member/caregiver   Family Member Consulted mother      Patient will benefit from skilled therapeutic intervention in order to improve the following deficits and impairments:  Decreased coordination, Impaired flexibility, Improper body mechanics, Improper spinal/pelvic alignment, Decreased safety awareness, Impaired tone, Impaired UE functional use, Decreased mobility, Decreased cognition, Increased muscle spasms, Decreased knowledge of use of DME  Visit Diagnosis: Spastic hemiplegia affecting right nondominant side, unspecified etiology (HCC)  Spastic hemiplegia affecting left dominant side, unspecified etiology ([place])  Other lack of coordination    Problem List There are no active problems to display for this patient.   Carey Bullocks, OTR/L 04/25/2017, 3:00 PM  [facility] [address] [place], [place],  [address] Phone: [phone]   Fax:  [phone]  Name: Taylor Fernandez MRN: [id number] Date of Birth: [birth year]

## 2017-04-27 ENCOUNTER — Ambulatory Visit: Payer: Medicaid Other | Admitting: Occupational Therapy

## 2017-04-27 DIAGNOSIS — G8113 Spastic hemiplegia affecting right nondominant side: Secondary | ICD-10-CM

## 2017-04-27 DIAGNOSIS — G8112 Spastic hemiplegia affecting left dominant side: Secondary | ICD-10-CM

## 2017-04-27 DIAGNOSIS — R278 Other lack of coordination: Secondary | ICD-10-CM

## 2017-04-27 NOTE — Therapy (Signed)
[facility] [address] [place], [place], [address] Phone: [phone]   Fax:  [phone]  Occupational Therapy Treatment  Patient Details  Name: Taylor Fernandez MRN: [id number] Date of Birth: [birth year] Referring Provider: Dr. Georga Hacking  Encounter Date: 04/27/2017      OT End of Session - 04/27/17 0952    Visit Number 8   Number of Visits 17   Date for OT Re-Evaluation 05/14/17   Authorization Type MCD   Authorization Time Period approved 04/04/17 - 05/29/17 for 16 visits   Authorization - Visit Number 8   Authorization - Number of Visits 16   OT Start Time 0810  pt arrived 10 min. late   OT Stop Time 0845   OT Time Calculation (min) 35 min   Activity Tolerance Patient tolerated treatment well      Past Medical History:  Diagnosis Date  . Cerebral palsy ([place])   . Developmental delay   . Wheelchair bound    Can not stand     Past Surgical History:  Procedure Laterality Date  . EYE MUSCLE SURGERY     21 yrs old and 29-12 yrs old  . KNEE SURGERY     Tendons extended on both legs  . STRABISMUS SURGERY Bilateral 04/03/2015   Procedure: REPAIR STRABISMUS PEDIATRIC;  Surgeon: Everitt Amber, MD;  Location: [facility];  Service: Ophthalmology;  Laterality: Bilateral;    There were no vitals filed for this visit.      Subjective Assessment - 04/27/17 0810    Patient is accompained by: Family member  mom   Pertinent History CP (Spastic), tendon lengthening LE's   Limitations bilateral AFO's   Currently in Pain? No/denies                      OT Treatments/Exercises (OP) - 04/27/17 0001      ADLs   Eating Practiced cutting with different handled rocker knives. Pt preferred short handled rocker knife with more control Lt hand. Pt able to simulate cutting "food" with mod cueing to rock vs. saw type motion   LB Dressing Pt able to simulate separating velcro in prep for  velcro converters on shoes.    ADL Comments Pt/mother given handouts on rocker knife and velcro converters and instructed in how/where to purchase     Fine Motor Coordination   Fine Motor Coordination Large Pegboard   Large Pegboard Pt placing large pegs in pegboard LUE initially on tabletop (first 2 rows) with little to no difficulty. Then progressed to performing on vertical surface to work on functional mid level reaching and trunk control (to come up off w/c) with coordination. Pt with mod difficulty and extra time, but could do successfully.                   OT Short Term Goals - 04/27/17 0954      OT SHORT TERM GOAL #1   Title Pt/family independent with initial HEP (STG's 04/14/17)    Baseline Dependent    Time 4   Period Weeks   Status Achieved     OT SHORT TERM GOAL #2   Title Pt/family independent with splint wear and care prn   Baseline dependent   Time 4   Period Weeks   Status Achieved     OT SHORT TERM GOAL #3   Title Pt/family to verbalize understanding with potential A/E and task modifications to increase ease/independence  with ADLS   Baseline dependent    Time 4   Period Weeks   Status Achieved     OT SHORT TERM GOAL #4   Title Pt/family to verbalize understanding with recommended muscles to address medically (botox injections) for spasitcity management and function   Baseline dependent   Time 4   Period Weeks   Status Achieved           OT Long Term Goals - 04/25/17 1457      OT LONG TERM GOAL #1   Title Improve Box & BLocks score LUE by 3 blocks or greater to increase functional reaching tasks (LTG's due 05/14/17)    Baseline 13   Time 8   Period Weeks   Status Revised     OT LONG TERM GOAL #2   Title Pt to perform UE dressing with mod assist or less   Baseline max assist   Time 8   Period Weeks   Status New     OT LONG TERM GOAL #3   Title Pt to provide min assist for LE dressing using bilateral hands   Baseline dependent   Time  8   Period Weeks   Status New     OT LONG TERM GOAL #4   Title Pt/family to have greater understanding of potential job entry possibilities with patient's physical and cognitive deficits   Baseline dependent   Time 8   Period Weeks   Status Deferred  Mother reports pt not a candidate for Voc Rehab at this time               Plan - 04/27/17 0954    Clinical Impression Statement Pt has now met all STG's. Progressing towards remaining goals.    Rehab Potential Good   Current Impairments/barriers affecting progress: severity of physical and cognitive deficits from CP   OT Frequency 2x / week   OT Duration 8 weeks   OT Treatment/Interventions Self-care/ADL training;DME and/or AE instruction;Splinting;Patient/family education;Therapeutic exercises;Therapeutic activities;Neuromuscular education;Functional Mobility Training;Passive range of motion;Cognitive remediation/compensation;Manual Therapy;Electrical Stimulation   Plan continue functional use of LUE, and removing large pegs with RUE if able (with splint on), begin practice of UE dressing      Patient will benefit from skilled therapeutic intervention in order to improve the following deficits and impairments:  Decreased coordination, Impaired flexibility, Improper body mechanics, Improper spinal/pelvic alignment, Decreased safety awareness, Impaired tone, Impaired UE functional use, Decreased mobility, Decreased cognition, Increased muscle spasms, Decreased knowledge of use of DME  Visit Diagnosis: Spastic hemiplegia affecting left dominant side, unspecified etiology (HCC)  Spastic hemiplegia affecting right nondominant side, unspecified etiology ([place])  Other lack of coordination    Problem List There are no active problems to display for this patient.   Carey Bullocks, OTR/L 04/27/2017, 9:56 AM  [facility] [address] [place], [place],  [address] Phone: [phone]   Fax:  [phone]  Name: Taylor Fernandez MRN: [id number] Date of Birth: [birth year]

## 2017-05-02 ENCOUNTER — Ambulatory Visit: Payer: Medicaid Other | Admitting: Occupational Therapy

## 2017-05-02 DIAGNOSIS — M24521 Contracture, right elbow: Secondary | ICD-10-CM | POA: Insufficient documentation

## 2017-05-04 ENCOUNTER — Ambulatory Visit: Payer: Medicaid Other | Admitting: Occupational Therapy

## 2017-05-04 DIAGNOSIS — G8113 Spastic hemiplegia affecting right nondominant side: Secondary | ICD-10-CM | POA: Diagnosis not present

## 2017-05-04 DIAGNOSIS — G8112 Spastic hemiplegia affecting left dominant side: Secondary | ICD-10-CM

## 2017-05-04 NOTE — Therapy (Signed)
[facility] [address] [place], [place], [address] Phone: [phone]   Fax:  [phone]  Occupational Therapy Treatment  Patient Details  Name: Taylor Fernandez MRN: [id number] Date of Birth: [birth year] Referring Provider: Dr. Georga Hacking  Encounter Date: 05/04/2017      OT End of Session - 05/04/17 1008    Visit Number 9   Number of Visits 17   Date for OT Re-Evaluation 05/29/17   Authorization Type MCD   Authorization Time Period approved 04/04/17 - 05/29/17 for 16 visits   Authorization - Visit Number 9   Authorization - Number of Visits 16   OT Start Time 0800   OT Stop Time 0845   OT Time Calculation (min) 45 min   Activity Tolerance Patient tolerated treatment well      Past Medical History:  Diagnosis Date  . Cerebral palsy ([place])   . Developmental delay   . Wheelchair bound    Can not stand     Past Surgical History:  Procedure Laterality Date  . EYE MUSCLE SURGERY     21 yrs old and 40-10 yrs old  . KNEE SURGERY     Tendons extended on both legs  . STRABISMUS SURGERY Bilateral 04/03/2015   Procedure: REPAIR STRABISMUS PEDIATRIC;  Surgeon: Everitt Amber, MD;  Location: [facility];  Service: Ophthalmology;  Laterality: Bilateral;    There were no vitals filed for this visit.      Subjective Assessment - 05/04/17 0805    Patient is accompained by: Family member  mom   Pertinent History CP (Spastic), tendon lengthening LE's   Limitations bilateral AFO's   Currently in Pain? No/denies                      OT Treatments/Exercises (OP) - 05/04/17 0001      ADLs   ADL Comments Pt's mother discussed with therapist recent MD visit - MD did not recommend botox at this time, but did order a dynamic splint for Rt elbow. Pt's mother also had questions about d/c from O.T. on targeted end date. Therapist explained that we will likely go until 05/29/17, but she was  concerned about d/c at that time. Explained to pt/mother that all functional goals will be addressed by that time, and no further functional changes would be anticipated, but if things were to change in the future, they could always resume therapy at that time.      Functional Reaching Activities   Mid Level LUE mid level reaching to place large pegs in pegboard to work on functional reaching, trunk control, Lt coordination as able. Attempted to remove pegs with Rt hand with pegboard on table, pt with max difficulty and mod assist secondary to spasitcity and inability to control graded pressure of hand.      Splinting   Splinting Placed Oval 8 on thumb IP joint of Rt hand to prevent hyperextension in an attempt to make grasping pegs from pegboard easier on tabletop (as mother did not bring in previously issued thumb splint). Oval 8 did prevent hyperextension, but only minimally helped with functional grasping d/t spasticity at wrist and other fingers as well. Nevertheless, pt's mother wanted to keep Oval 8 splint to practice with.                   OT Short Term Goals - 04/27/17 0954      OT SHORT TERM GOAL #1  Title Pt/family independent with initial HEP (STG's 04/14/17)    Baseline Dependent    Time 4   Period Weeks   Status Achieved     OT SHORT TERM GOAL #2   Title Pt/family independent with splint wear and care prn   Baseline dependent   Time 4   Period Weeks   Status Achieved     OT SHORT TERM GOAL #3   Title Pt/family to verbalize understanding with potential A/E and task modifications to increase ease/independence with ADLS   Baseline dependent    Time 4   Period Weeks   Status Achieved     OT SHORT TERM GOAL #4   Title Pt/family to verbalize understanding with recommended muscles to address medically (botox injections) for spasitcity management and function   Baseline dependent   Time 4   Period Weeks   Status Achieved           OT Long Term Goals -  04/25/17 1457      OT LONG TERM GOAL #1   Title Improve Box & BLocks score LUE by 3 blocks or greater to increase functional reaching tasks (LTG's due 05/14/17)    Baseline 13   Time 8   Period Weeks   Status Revised     OT LONG TERM GOAL #2   Title Pt to perform UE dressing with mod assist or less   Baseline max assist   Time 8   Period Weeks   Status New     OT LONG TERM GOAL #3   Title Pt to provide min assist for LE dressing using bilateral hands   Baseline dependent   Time 8   Period Weeks   Status New     OT LONG TERM GOAL #4   Title Pt/family to have greater understanding of potential job entry possibilities with patient's physical and cognitive deficits   Baseline dependent   Time 8   Period Weeks   Status Deferred  Mother reports pt not a candidate for Voc Rehab at this time               Plan - 05/04/17 1009    Clinical Impression Statement Pt improving with LUE functional reaching   Rehab Potential Good   Current Impairments/barriers affecting progress: severity of physical and cognitive deficits from CP   OT Frequency 2x / week   OT Duration 8 weeks   OT Treatment/Interventions Self-care/ADL training;DME and/or AE instruction;Splinting;Patient/family education;Therapeutic exercises;Therapeutic activities;Neuromuscular education;Functional Mobility Training;Passive range of motion;Cognitive remediation/compensation;Manual Therapy;Electrical Stimulation   Plan provided functional reaching and coordination activities pt could perform at home for LUE, begin practice of UE dressing   Consulted and Agree with Plan of Care Patient      Patient will benefit from skilled therapeutic intervention in order to improve the following deficits and impairments:  Decreased coordination, Impaired flexibility, Improper body mechanics, Improper spinal/pelvic alignment, Decreased safety awareness, Impaired tone, Impaired UE functional use, Decreased mobility, Decreased  cognition, Increased muscle spasms, Decreased knowledge of use of DME  Visit Diagnosis: Spastic hemiplegia affecting left dominant side, unspecified etiology (HCC)  Spastic hemiplegia affecting right nondominant side, unspecified etiology ([place])    Problem List There are no active problems to display for this patient.   Carey Bullocks, OTR/L 05/04/2017, 10:11 AM  [facility] [address] [place], [place], [address] Phone: [phone]   Fax:  [phone]  Name: Taylor Fernandez MRN: [id number] Date of Birth:  09/23/1996 

## 2017-05-09 ENCOUNTER — Ambulatory Visit: Payer: Medicaid Other | Admitting: Occupational Therapy

## 2017-05-10 ENCOUNTER — Ambulatory Visit: Payer: Medicaid Other | Attending: Physical Medicine and Rehabilitation | Admitting: Occupational Therapy

## 2017-05-10 DIAGNOSIS — G8113 Spastic hemiplegia affecting right nondominant side: Secondary | ICD-10-CM | POA: Insufficient documentation

## 2017-05-10 DIAGNOSIS — R278 Other lack of coordination: Secondary | ICD-10-CM | POA: Insufficient documentation

## 2017-05-10 DIAGNOSIS — G8112 Spastic hemiplegia affecting left dominant side: Secondary | ICD-10-CM | POA: Insufficient documentation

## 2017-05-10 NOTE — Therapy (Signed)
[facility] [address] [place], [place], [address] Phone: [phone]   Fax:  [phone]  Occupational Therapy Treatment  Patient Details  Name: Taylor Fernandez MRN: [id number] Date of Birth: 07-01-1996 Referring Provider: Dr. Georga Hacking  Encounter Date: 05/10/2017      OT End of Session - 05/10/17 1255    Visit Number 10   Number of Visits 17   Date for OT Re-Evaluation 05/29/17   Authorization Type MCD   Authorization Time Period approved 04/04/17 - 05/29/17 for 16 visits   Authorization - Visit Number 10   Authorization - Number of Visits 16   OT Start Time 0851   OT Stop Time 0930   OT Time Calculation (min) 39 min   Activity Tolerance Patient tolerated treatment well      Past Medical History:  Diagnosis Date  . Cerebral palsy ([place])   . Developmental delay   . Wheelchair bound    Can not stand     Past Surgical History:  Procedure Laterality Date  . EYE MUSCLE SURGERY     21 yrs old and 19-12 yrs old  . KNEE SURGERY     Tendons extended on both legs  . STRABISMUS SURGERY Bilateral 04/03/2015   Procedure: REPAIR STRABISMUS PEDIATRIC;  Surgeon: Everitt Amber, MD;  Location: [facility];  Service: Ophthalmology;  Laterality: Bilateral;    There were no vitals filed for this visit.      Subjective Assessment - 05/10/17 0922    Pertinent History CP (Spastic), tendon lengthening LE's   Limitations bilateral AFO's   Currently in Pain? No/denies          Treatment: seated at table activities to increase bilateral LUE functional use and reach, including placing connect four checkers in frame, placing and removing large pegs from pegboard, flipping cards to play war.  Pt used primarily his LUE, however with prefab splint in place pt is able to position items in RUE as an assist.                      OT Short Term Goals - 04/27/17 0954      OT SHORT TERM  GOAL #1   Title Pt/family independent with initial HEP (STG's 04/14/17)    Baseline Dependent    Time 4   Period Weeks   Status Achieved     OT SHORT TERM GOAL #2   Title Pt/family independent with splint wear and care prn   Baseline dependent   Time 4   Period Weeks   Status Achieved     OT SHORT TERM GOAL #3   Title Pt/family to verbalize understanding with potential A/E and task modifications to increase ease/independence with ADLS   Baseline dependent    Time 4   Period Weeks   Status Achieved     OT SHORT TERM GOAL #4   Title Pt/family to verbalize understanding with recommended muscles to address medically (botox injections) for spasitcity management and function   Baseline dependent   Time 4   Period Weeks   Status Achieved           OT Long Term Goals - 04/25/17 1457      OT LONG TERM GOAL #1   Title Improve Box & BLocks score LUE by 3 blocks or greater to increase functional reaching tasks (LTG's due 05/14/17)    Baseline 13   Time 8   Period Weeks  Status Revised     OT LONG TERM GOAL #2   Title Pt to perform UE dressing with mod assist or less   Baseline max assist   Time 8   Period Weeks   Status New     OT LONG TERM GOAL #3   Title Pt to provide min assist for LE dressing using bilateral hands   Baseline dependent   Time 8   Period Weeks   Status New     OT LONG TERM GOAL #4   Title Pt/family to have greater understanding of potential job entry possibilities with patient's physical and cognitive deficits   Baseline dependent   Time 8   Period Weeks   Status Deferred  Mother reports pt not a candidate for Voc Rehab at this time               Plan - 05/10/17 1258    Clinical Impression Statement Pt is progressing towards goals with improving bilateral functional reaching.   Rehab Potential Good   Current Impairments/barriers affecting progress: severity of physical and cognitive deficits from CP   OT Frequency 2x / week   OT  Duration 8 weeks   OT Treatment/Interventions Self-care/ADL training;DME and/or AE instruction;Splinting;Patient/family education;Therapeutic exercises;Therapeutic activities;Neuromuscular education;Functional Mobility Training;Passive range of motion;Cognitive remediation/compensation;Manual Therapy;Electrical Stimulation   Plan continue to work towards goals.   Consulted and Agree with Plan of Care Patient      Patient will benefit from skilled therapeutic intervention in order to improve the following deficits and impairments:  Decreased coordination, Impaired flexibility, Improper body mechanics, Improper spinal/pelvic alignment, Decreased safety awareness, Impaired tone, Impaired UE functional use, Decreased mobility, Decreased cognition, Increased muscle spasms, Decreased knowledge of use of DME  Visit Diagnosis: Spastic hemiplegia affecting left dominant side, unspecified etiology (HCC)  Spastic hemiplegia affecting right nondominant side, unspecified etiology ([place])  Other lack of coordination    Problem List There are no active problems to display for this patient.   RINE,KATHRYN 05/10/2017, 12:59 PM  [facility] [address] [place], [place], [address] Phone: [phone]   Fax:  [phone]  Name: Taylor Fernandez MRN: [id number] Date of Birth: 04/28/1996

## 2017-05-11 ENCOUNTER — Ambulatory Visit: Payer: Medicaid Other | Admitting: Occupational Therapy

## 2017-05-11 DIAGNOSIS — R278 Other lack of coordination: Secondary | ICD-10-CM

## 2017-05-11 DIAGNOSIS — G8113 Spastic hemiplegia affecting right nondominant side: Secondary | ICD-10-CM

## 2017-05-11 DIAGNOSIS — G8112 Spastic hemiplegia affecting left dominant side: Secondary | ICD-10-CM | POA: Diagnosis not present

## 2017-05-11 NOTE — Therapy (Signed)
[facility] [address] [place], [place], [address] Phone: [phone]   Fax:  [phone]  Occupational Therapy Treatment  Patient Details  Name: ABDULKARIM Fernandez MRN: [id number] Date of Birth: 16-Nov-1995 Referring Provider: Dr. Georga Hacking  Encounter Date: 05/11/2017      OT End of Session - 05/11/17 1148    Visit Number 11   Number of Visits 17   Date for OT Re-Evaluation 05/29/17   Authorization Type MCD   Authorization Time Period approved 04/04/17 - 05/29/17 for 16 visits   Authorization - Visit Number 11   Authorization - Number of Visits 16   OT Start Time 0845   OT Stop Time 0930   OT Time Calculation (min) 45 min   Activity Tolerance Patient tolerated treatment well      Past Medical History:  Diagnosis Date  . Cerebral palsy ([place])   . Developmental delay   . Wheelchair bound    Can not stand     Past Surgical History:  Procedure Laterality Date  . EYE MUSCLE SURGERY     21 yrs old and 58-12 yrs old  . KNEE SURGERY     Tendons extended on both legs  . STRABISMUS SURGERY Bilateral 04/03/2015   Procedure: REPAIR STRABISMUS PEDIATRIC;  Surgeon: Everitt Amber, MD;  Location: [facility];  Service: Ophthalmology;  Laterality: Bilateral;    There were no vitals filed for this visit.      Subjective Assessment - 05/11/17 1141    Subjective  I used to sort hangers by colors/sizes   Patient is accompained by: Family member  caregiver, dad present at end of session for education/review   Pertinent History CP (Spastic), tendon lengthening LE's   Limitations bilateral AFO's   Currently in Pain? No/denies                      OT Treatments/Exercises (OP) - 05/11/17 0001      ADLs   UB Dressing Pt doffed shirt with mod cueing but physically able to do I'ly. Pt donned shirt with mod cueing and only min assist to pull shirt down in back    LB Dressing Reaching down  for simulation of hooking/unhooking velcro on shoes with foot propped on stool - pt could do with LLE with only close sup when bending over d/t spasms. However, required min assist to prop Rt LE on stool and CGA/min assist for safety when bending over d/t spasms. Recommended to caregiver (and later with father) that pt practice coming up off w/c at trunk (seated) and bending over with close sup/CGA for trunk control and prep for LE dressing   ADL Comments Discussed dressing strategies with caregiver and possible use of reacher for hooking/unhooking velcro on shoes (once mother orders velcro converters)     Fine Motor Coordination   Other Fine Motor Exercises Pt issued coordination and functional reaching activities to perform with LUE - see pt instructions for details                OT Education - 05/11/17 0903    Education provided Yes   Education Details Coordination and reaching HEP for LUE, verbal educ. re: strategies and A/E needs for dressing   Person(s) Educated Patient;Caregiver(s)   Methods Explanation;Demonstration;Handout   Comprehension Verbalized understanding;Returned demonstration          OT Short Term Goals - 04/27/17 0954      OT SHORT TERM  GOAL #1   Title Pt/family independent with initial HEP (STG's 04/14/17)    Baseline Dependent    Time 4   Period Weeks   Status Achieved     OT SHORT TERM GOAL #2   Title Pt/family independent with splint wear and care prn   Baseline dependent   Time 4   Period Weeks   Status Achieved     OT SHORT TERM GOAL #3   Title Pt/family to verbalize understanding with potential A/E and task modifications to increase ease/independence with ADLS   Baseline dependent    Time 4   Period Weeks   Status Achieved     OT SHORT TERM GOAL #4   Title Pt/family to verbalize understanding with recommended muscles to address medically (botox injections) for spasitcity management and function   Baseline dependent   Time 4   Period  Weeks   Status Achieved           OT Long Term Goals - 05/11/17 1149      OT LONG TERM GOAL #1   Title Improve Box & BLocks score LUE by 3 blocks or greater to increase functional reaching tasks (LTG's due 05/14/17)    Baseline 13   Time 8   Period Weeks   Status Revised     OT LONG TERM GOAL #2   Title Pt to perform UE dressing with mod assist or less   Baseline max assist   Time 8   Period Weeks   Status Achieved  min assist with cueing     OT LONG TERM GOAL #3   Title Pt to provide min assist for LE dressing using bilateral hands   Baseline dependent   Time 8   Period Weeks   Status On-going     OT LONG TERM GOAL #4   Title Pt/family to have greater understanding of potential job entry possibilities with patient's physical and cognitive deficits   Baseline dependent   Time 8   Period Weeks   Status Deferred  Mother reports pt not a candidate for Voc Rehab at this time               Plan - 05/11/17 1150    Clinical Impression Statement Pt progressing towards greater independence with dressing and ADLS. Pt limited by spasms   Rehab Potential Good   Current Impairments/barriers affecting progress: severity of physical and cognitive deficits from CP   OT Frequency 2x / week   OT Duration 8 weeks   OT Treatment/Interventions Self-care/ADL training;DME and/or AE instruction;Splinting;Patient/family education;Therapeutic exercises;Therapeutic activities;Neuromuscular education;Functional Mobility Training;Passive range of motion;Cognitive remediation/compensation;Manual Therapy;Electrical Stimulation   Plan review HEP and dressing strategies with family, review recommendation for trunk control and prep for LE dressing, practice propelling w/c if seatbelt available, if time - dynamic sitting EOB for trunk control    Consulted and Agree with Plan of Care Patient;Family member/caregiver   Family Member Consulted caregiver, spoke with dad at end of session       Patient will benefit from skilled therapeutic intervention in order to improve the following deficits and impairments:  Decreased coordination, Impaired flexibility, Improper body mechanics, Improper spinal/pelvic alignment, Decreased safety awareness, Impaired tone, Impaired UE functional use, Decreased mobility, Decreased cognition, Increased muscle spasms, Decreased knowledge of use of DME  Visit Diagnosis: Spastic hemiplegia affecting left dominant side, unspecified etiology (HCC)  Spastic hemiplegia affecting right nondominant side, unspecified etiology ([place])  Other lack of coordination    Problem List There  are no active problems to display for this patient.   Carey Bullocks, OTR/L 05/11/2017, 11:53 AM  [facility] [address] [place], [place], [address] Phone: [phone]   Fax:  [phone]  Name: Taylor Fernandez MRN: [id number] Date of Birth: [birth year]

## 2017-05-11 NOTE — Patient Instructions (Signed)
  Coordination Activities  Perform the following activities for 15 minutes 2 times per day with left hand(s).   Flip cards 1 at a time as fast as you can. Then you can sort cards by suites or color  Deal cards with your thumb (Hold deck in hand and push card off top with thumb).  Pick up coins and stack (pennies).  Pick up coins (nickels or quarters) one at a time until you get 5 in your hand, then move coins from palm to fingertips to stack one at a time.  Play Connect 4 or just pick up checkers and put into slots - make sure it is high enough to work trunk control too (can place a couple thick books underneath Connect 4

## 2017-05-15 ENCOUNTER — Ambulatory Visit: Payer: Medicaid Other | Admitting: Occupational Therapy

## 2017-05-15 DIAGNOSIS — G8112 Spastic hemiplegia affecting left dominant side: Secondary | ICD-10-CM

## 2017-05-15 DIAGNOSIS — G8113 Spastic hemiplegia affecting right nondominant side: Secondary | ICD-10-CM

## 2017-05-15 NOTE — Therapy (Signed)
[facility] [address] [place], [place], [address] Phone: [phone]   Fax:  [phone]  Occupational Therapy Treatment  Patient Details  Name: Taylor Fernandez MRN: [id number] Date of Birth: 1996-08-18 Referring Provider: Dr. Georga Hacking  Encounter Date: 05/15/2017      OT End of Session - 05/15/17 1156    Visit Number 12   Number of Visits 17   Date for OT Re-Evaluation 05/29/17   Authorization Type MCD   Authorization Time Period approved 04/04/17 - 05/29/17 for 16 visits   Authorization - Visit Number 12   Authorization - Number of Visits 16   OT Start Time 0810  pt arrived 10 min. late   OT Stop Time 0845   OT Time Calculation (min) 35 min   Activity Tolerance Patient tolerated treatment well      Past Medical History:  Diagnosis Date  . Cerebral palsy ([place])   . Developmental delay   . Wheelchair bound    Can not stand     Past Surgical History:  Procedure Laterality Date  . EYE MUSCLE SURGERY     21 yrs old and 37-12 yrs old  . KNEE SURGERY     Tendons extended on both legs  . STRABISMUS SURGERY Bilateral 04/03/2015   Procedure: REPAIR STRABISMUS PEDIATRIC;  Surgeon: Everitt Amber, MD;  Location: [facility];  Service: Ophthalmology;  Laterality: Bilateral;    There were no vitals filed for this visit.      Subjective Assessment - 05/15/17 0810    Subjective  Denies pain   Patient is accompained by: Family member  mom   Pertinent History CP (Spastic), tendon lengthening LE's   Limitations bilateral AFO's   Currently in Pain? No/denies                      OT Treatments/Exercises (OP) - 05/15/17 0001      ADLs   UB Dressing Pt practiced UE dressing with only min assist to pull down in back and mod cueing when donning, min cueing only to doff (however mother did not return until end of session and therefore just verbally reviewed strategies with her)   LB Dressing Reaching down to touch shoes while feet propped on stool in prep for LE dressing and trunk control with close supervision   ADL Comments Attempted to self propel w/c but pt does not have enough control in LUE and LLE to perform. Pt reports he has a motorized w/c at home     Fine Motor Coordination   Other Fine Motor Exercises Reviewed functional coordination HEP with patient and showed mom at end of session in hand manipulation ex with coins     Functional Reaching Activities   Mid Level Pt performing LUE reaching to place pennies in cones and counting 10 for each cone with min cueing needed to keep pt equally distributing pennies. Pt would become distracted and forget which number he was on                  OT Short Term Goals - 04/27/17 0954      OT SHORT TERM GOAL #1   Title Pt/family independent with initial HEP (STG's 04/14/17)    Baseline Dependent    Time 4   Period Weeks   Status Achieved     OT SHORT TERM GOAL #2   Title Pt/family independent with splint wear and care prn  Baseline dependent   Time 4   Period Weeks   Status Achieved     OT SHORT TERM GOAL #3   Title Pt/family to verbalize understanding with potential A/E and task modifications to increase ease/independence with ADLS   Baseline dependent    Time 4   Period Weeks   Status Achieved     OT SHORT TERM GOAL #4   Title Pt/family to verbalize understanding with recommended muscles to address medically (botox injections) for spasitcity management and function   Baseline dependent   Time 4   Period Weeks   Status Achieved           OT Long Term Goals - 05/11/17 1149      OT LONG TERM GOAL #1   Title Improve Box & BLocks score LUE by 3 blocks or greater to increase functional reaching tasks (LTG's due 05/14/17)    Baseline 13   Time 8   Period Weeks   Status Revised     OT LONG TERM GOAL #2   Title Pt to perform UE dressing with mod assist or less   Baseline max assist   Time  8   Period Weeks   Status Achieved  min assist with cueing     OT LONG TERM GOAL #3   Title Pt to provide min assist for LE dressing using bilateral hands   Baseline dependent   Time 8   Period Weeks   Status On-going     OT LONG TERM GOAL #4   Title Pt/family to have greater understanding of potential job entry possibilities with patient's physical and cognitive deficits   Baseline dependent   Time 8   Period Weeks   Status Deferred  Mother reports pt not a candidate for Voc Rehab at this time               Plan - 05/15/17 1156    Clinical Impression Statement Pt progressing with functional use of LUE.    Rehab Potential Good   Current Impairments/barriers affecting progress: severity of physical and cognitive deficits from CP   OT Frequency 2x / week   OT Duration 8 weeks   OT Treatment/Interventions Self-care/ADL training;DME and/or AE instruction;Splinting;Patient/family education;Therapeutic exercises;Therapeutic activities;Neuromuscular education;Functional Mobility Training;Passive range of motion;Cognitive remediation/compensation;Manual Therapy;Electrical Stimulation   Plan review recommendations for trunk control in prep for LE dressing, dynamic sitting EOB for trunk control      Patient will benefit from skilled therapeutic intervention in order to improve the following deficits and impairments:  Decreased coordination, Impaired flexibility, Improper body mechanics, Improper spinal/pelvic alignment, Decreased safety awareness, Impaired tone, Impaired UE functional use, Decreased mobility, Decreased cognition, Increased muscle spasms, Decreased knowledge of use of DME  Visit Diagnosis: Spastic hemiplegia affecting left dominant side, unspecified etiology (HCC)  Spastic hemiplegia affecting right nondominant side, unspecified etiology ([place])    Problem List There are no active problems to display for this patient.   Carey Bullocks, OTR/L 05/15/2017,  11:58 AM  [facility] [address] [place], [place], [address] Phone: [phone]   Fax:  [phone]  Name: DANYL DEEMS MRN: [id number] Date of Birth: [birth year]

## 2017-05-16 ENCOUNTER — Ambulatory Visit: Payer: Medicaid Other | Admitting: Occupational Therapy

## 2017-05-16 DIAGNOSIS — G8113 Spastic hemiplegia affecting right nondominant side: Secondary | ICD-10-CM

## 2017-05-16 DIAGNOSIS — G8112 Spastic hemiplegia affecting left dominant side: Secondary | ICD-10-CM

## 2017-05-16 DIAGNOSIS — R278 Other lack of coordination: Secondary | ICD-10-CM

## 2017-05-16 NOTE — Therapy (Signed)
[facility] [address] [place], [place], [address] Phone: [phone]   Fax:  [phone]  Occupational Therapy Treatment  Patient Details  Name: Taylor Fernandez MRN: [id number] Date of Birth: January 15, 1996 Referring Provider: Dr. Georga Hacking  Encounter Date: 05/16/2017      OT End of Session - 05/16/17 0954    Visit Number 13   Number of Visits 17   Date for OT Re-Evaluation 05/29/17   Authorization Type MCD   Authorization Time Period approved 04/04/17 - 05/29/17 for 16 visits   Authorization - Visit Number 42   Authorization - Number of Visits 16   OT Start Time 0800   OT Stop Time 0845   OT Time Calculation (min) 45 min   Activity Tolerance Patient tolerated treatment well      Past Medical History:  Diagnosis Date  . Cerebral palsy ([place])   . Developmental delay   . Wheelchair bound    Can not stand     Past Surgical History:  Procedure Laterality Date  . EYE MUSCLE SURGERY     21 yrs old and 52-12 yrs old  . KNEE SURGERY     Tendons extended on both legs  . STRABISMUS SURGERY Bilateral 04/03/2015   Procedure: REPAIR STRABISMUS PEDIATRIC;  Surgeon: Everitt Amber, MD;  Location: [facility];  Service: Ophthalmology;  Laterality: Bilateral;    There were no vitals filed for this visit.      Subjective Assessment - 05/16/17 0947    Subjective  Mother reports they will keep pt's baclofen pump in and get a lower dose baclofen hopefully in December   Patient is accompained by: Family member  MOTHER   Pertinent History CP (Spastic), tendon lengthening LE's   Limitations bilateral AFO's   Currently in Pain? No/denies                      OT Treatments/Exercises (OP) - 05/16/17 0001      Neurological Re-education Exercises   Other Exercises 1 Seated EOB (mat) for dynamic tasks for trunk control: pt reaching down to touch shoes alternating UE's, leaning side to side  over elbows with therapist support for lateral trunk activation, and going from semi-inclined position to upright position for core control. Pt required CGA to min assist d/t occasional uncontrolled spasms. Pt very hesistant d/t fear of losing sitting balance     Functional Reaching Activities   Mid Level LUE mid level reaching sitting unsupported EOB (with CGA from therapist for safety) with mod difficulty placing large pegs in pegboard vertical surface LUE d/t trunk unsupported. (When patient performs same task in w/c, pt has little to no difficulty)      Encouraged pt/mother to have pt sit up off w/c backing t/o day for trunk/core activation with supervision             OT Short Term Goals - 04/27/17 0954      OT [place] #1   Title Pt/family independent with initial HEP (STG's 04/14/17)    Baseline Dependent    Time 4   Period Weeks   Status Achieved     OT SHORT TERM GOAL #2   Title Pt/family independent with splint wear and care prn   Baseline dependent   Time 4   Period Weeks   Status Achieved     OT SHORT TERM GOAL #3   Title Pt/family to verbalize understanding with potential A/E  and task modifications to increase ease/independence with ADLS   Baseline dependent    Time 4   Period Weeks   Status Achieved     OT SHORT TERM GOAL #4   Title Pt/family to verbalize understanding with recommended muscles to address medically (botox injections) for spasitcity management and function   Baseline dependent   Time 4   Period Weeks   Status Achieved           OT Long Term Goals - 05/11/17 1149      OT LONG TERM GOAL #1   Title Improve Box & BLocks score LUE by 3 blocks or greater to increase functional reaching tasks (LTG's due 05/14/17)    Baseline 13   Time 8   Period Weeks   Status Revised     OT LONG TERM GOAL #2   Title Pt to perform UE dressing with mod assist or less   Baseline max assist   Time 8   Period Weeks   Status Achieved  min assist  with cueing     OT LONG TERM GOAL #3   Title Pt to provide min assist for LE dressing using bilateral hands   Baseline dependent   Time 8   Period Weeks   Status On-going     OT LONG TERM GOAL #4   Title Pt/family to have greater understanding of potential job entry possibilities with patient's physical and cognitive deficits   Baseline dependent   Time 8   Period Weeks   Status Deferred  Mother reports pt not a candidate for Voc Rehab at this time               Plan - 05/16/17 0954    Clinical Impression Statement Pt limited by spasticity and uncontrollable spasms. Pt reaching max rehab potential at this time and anticipate d/c next week.    Rehab Potential Good   Current Impairments/barriers affecting progress: severity of physical and cognitive deficits from CP   OT Frequency 2x / week   OT Duration 8 weeks   OT Treatment/Interventions Self-care/ADL training;DME and/or AE instruction;Splinting;Patient/family education;Therapeutic exercises;Therapeutic activities;Neuromuscular education;Functional Mobility Training;Passive range of motion;Cognitive remediation/compensation;Manual Therapy;Electrical Stimulation   Plan address any other concerns by family, anticipate d/c end of next week      Patient will benefit from skilled therapeutic intervention in order to improve the following deficits and impairments:  Decreased coordination, Impaired flexibility, Improper body mechanics, Improper spinal/pelvic alignment, Decreased safety awareness, Impaired tone, Impaired UE functional use, Decreased mobility, Decreased cognition, Increased muscle spasms, Decreased knowledge of use of DME  Visit Diagnosis: Spastic hemiplegia affecting left dominant side, unspecified etiology (HCC)  Spastic hemiplegia affecting right nondominant side, unspecified etiology ([place])  Other lack of coordination    Problem List There are no active problems to display for this patient.   Carey Bullocks, OTR/L 05/16/2017, 9:56 AM  [facility] [address] [place], [place], [address] Phone: [phone]   Fax:  [phone]  Name: Taylor Fernandez MRN: [id number] Date of Birth: Jun 06, 1996

## 2017-05-22 ENCOUNTER — Encounter: Payer: Medicaid Other | Admitting: Occupational Therapy

## 2017-05-23 ENCOUNTER — Ambulatory Visit: Payer: Medicaid Other | Admitting: Occupational Therapy

## 2017-05-23 DIAGNOSIS — G8112 Spastic hemiplegia affecting left dominant side: Secondary | ICD-10-CM

## 2017-05-23 DIAGNOSIS — R278 Other lack of coordination: Secondary | ICD-10-CM

## 2017-05-23 NOTE — Therapy (Signed)
[facility] [address] , [place], [address] Phone: [phone]   Fax:  [phone]  Occupational Therapy Treatment  Patient Details  Name: Taylor Fernandez MRN: [id number] Date of Birth: 12/10/1995 Referring Provider: Dr. Kathleen Kolaski  Encounter Date: 05/23/2017      OT End of Session - 05/23/17 0820    Visit Number 14   Number of Visits 17   Date for OT Re-Evaluation 05/29/17   Authorization Type MCD   Authorization Time Period approved 04/04/17 - 05/29/17 for 16 visits   Authorization - Visit Number 14   Authorization - Number of Visits 16   OT Start Time 0800   OT Stop Time 0840   OT Time Calculation (min) 40 min   Activity Tolerance Patient tolerated treatment well      Past Medical History:  Diagnosis Date  . Cerebral palsy (HCC)   . Developmental delay   . Wheelchair bound    Can not stand     Past Surgical History:  Procedure Laterality Date  . EYE MUSCLE SURGERY     21 yrs old and 9-10 yrs old  . KNEE SURGERY     Tendons extended on both legs  . STRABISMUS SURGERY Bilateral 04/03/2015   Procedure: REPAIR STRABISMUS PEDIATRIC;  Surgeon: William Young, MD;  Location: [facility];  Service: Ophthalmology;  Laterality: Bilateral;    There were no vitals filed for this visit.      Subjective Assessment - 05/23/17 0823    Subjective  We start school tomorrow. Per mother, "I don't have any further questions"   Patient is accompained by: Family member  mother   Pertinent History CP (Spastic), tendon lengthening LE's   Limitations bilateral AFO's   Currently in Pain? No/denies            [facility] OT Assessment - 05/23/17 0001      Coordination   Box and Blocks Lt = 17 blocks                  OT Treatments/Exercises (OP) - 05/23/17 0001      Fine Motor Coordination   Other Fine Motor Exercises Flipping large cards over with Lt hand while dividing into  suites for cognitive component. Pt also using Rt hand to hold card occasionally     Functional Reaching Activities   Mid Level Mid level reaching to place rubber washers on various height prongs LUE.    High Level Placing clothespins on antenna and then removing for high level reaching LUE (yellow to green resistance)                   OT Short Term Goals - 04/27/17 0954      OT SHORT TERM GOAL #1   Title Pt/family independent with initial HEP (STG's 04/14/17)    Baseline Dependent    Time 4   Period Weeks   Status Achieved     OT SHORT TERM GOAL #2   Title Pt/family independent with splint wear and care prn   Baseline dependent   Time 4   Period Weeks   Status Achieved     OT SHORT TERM GOAL #3   Title Pt/family to verbalize understanding with potential A/E and task modifications to increase ease/independence with ADLS   Baseline dependent    Time 4   Period Weeks   Status Achieved     OT SHORT TERM GOAL #4   Title   Pt/family to verbalize understanding with recommended muscles to address medically (botox injections) for spasitcity management and function   Baseline dependent   Time 4   Period Weeks   Status Achieved           OT Long Term Goals - 05/23/17 6568      OT LONG TERM GOAL #1   Title Improve Box & BLocks score LUE by 3 blocks or greater to increase functional reaching tasks (LTG's due 05/14/17)    Baseline 13   Time 8   Period Weeks   Status Achieved  17 BLOCKS     OT LONG TERM GOAL #2   Title Pt to perform UE dressing with mod assist or less   Baseline max assist   Time 8   Period Weeks   Status Achieved  min assist with cueing     OT LONG TERM GOAL #3   Title Pt to provide min assist for LE dressing using bilateral hands   Baseline dependent   Time 8   Period Weeks   Status Achieved  per report     OT LONG TERM GOAL #4   Title Pt/family to have greater understanding of potential job entry possibilities with patient's physical and  cognitive deficits   Baseline dependent   Time 8   Period Weeks   Status Deferred  Mother reports pt not a candidate for Voc Rehab at this time               Plan - 05/23/17 1275    Clinical Impression Statement Pt met all STG's and LTG's.    Rehab Potential Good   Current Impairments/barriers affecting progress: severity of physical and cognitive deficits from CP   OT Treatment/Interventions Self-care/ADL training;DME and/or AE instruction;Splinting;Patient/family education;Therapeutic exercises;Therapeutic activities;Neuromuscular education;Functional Mobility Training;Passive range of motion;Cognitive remediation/compensation;Manual Therapy;Electrical Stimulation   Plan D/C O.Donnajean Lopes and Agree with Plan of Care Patient;Family member/caregiver   Family Member Consulted mother      Patient will benefit from skilled therapeutic intervention in order to improve the following deficits and impairments:  Decreased coordination, Impaired flexibility, Improper body mechanics, Improper spinal/pelvic alignment, Decreased safety awareness, Impaired tone, Impaired UE functional use, Decreased mobility, Decreased cognition, Increased muscle spasms, Decreased knowledge of use of DME  Visit Diagnosis: Spastic hemiplegia affecting left dominant side, unspecified etiology ([place])  Other lack of coordination    Problem List There are no active problems to display for this patient.    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  Patient goals were met. Patient is being discharged due to meeting the stated rehab goals.  ?????          Carey Bullocks,  OTR/L 05/23/2017, 8:37 AM  [facility] [address] [place], [place], [address] Phone: [phone]   Fax:  [phone]  Name: Taylor Fernandez MRN: [id number] Date of Birth: 22-Mar-1996

## 2017-05-24 ENCOUNTER — Encounter: Payer: Medicaid Other | Admitting: Occupational Therapy

## 2017-11-09 DIAGNOSIS — J45909 Unspecified asthma, uncomplicated: Secondary | ICD-10-CM | POA: Insufficient documentation

## 2018-03-07 ENCOUNTER — Other Ambulatory Visit: Payer: Self-pay

## 2018-03-09 ENCOUNTER — Ambulatory Visit (INDEPENDENT_AMBULATORY_CARE_PROVIDER_SITE_OTHER): Payer: Medicaid Other | Admitting: Podiatry

## 2018-03-09 DIAGNOSIS — Q828 Other specified congenital malformations of skin: Secondary | ICD-10-CM | POA: Diagnosis not present

## 2018-03-09 NOTE — Progress Notes (Signed)
  Subjective:  Patient ID: Taylor Fernandez, male    DOB: Feb 19, 1996,  MRN: [id number]  No chief complaint on file.  22 y.o. male presents with the above complaint. R Great toe callus present for several weeks. Wheelchair bound 2/2 CP.  Past Medical History:  Diagnosis Date  . Cerebral palsy ([place])   . Developmental delay   . Wheelchair bound    Can not stand    Past Surgical History:  Procedure Laterality Date  . EYE MUSCLE SURGERY     22 yrs old and 34-12 yrs old  . KNEE SURGERY     Tendons extended on both legs  . STRABISMUS SURGERY Bilateral 04/03/2015   Procedure: REPAIR STRABISMUS PEDIATRIC;  Surgeon: Everitt Amber, MD;  Location: [facility];  Service: Ophthalmology;  Laterality: Bilateral;    Current Outpatient Medications:  .  ALBUTEROL SULFATE IN, Inhale into the lungs as needed., Disp: , Rfl:  .  baclofen (LIORESAL) 10 MG/5ML SOLN, by Intrathecal route once., Disp: , Rfl:  .  cetirizine HCl (CETIRIZINE HCL ALLERGY CHILD) 5 MG/5ML SOLN, TK 10 ML PO ONCE A DAY, Disp: , Rfl:  .  Cholecalciferol (VITAMIN D3) 5000 units TABS, Take by mouth., Disp: , Rfl:  .  fluticasone (FLONASE) 50 MCG/ACT nasal spray, U 2 SPRAYS NASALLY ONCE D, Disp: , Rfl:  .  hydrocortisone 2.5 % cream, APP EXT AA BID FOR 10 DAYS, Disp: , Rfl:  .  ibuprofen (AF-IBUPROFEN CHILD) 100 MG/5ML suspension, 15 ml po tid prn for pain or swelling (Patient not taking: Reported on 03/15/2017), Disp: 240 mL, Rfl: 0 .  lidocaine-prilocaine (EMLA) cream, Apply topically as directed 30 min prior to pump refills, Disp: , Rfl:  .  sulfamethoxazole-trimethoprim (BACTRIM DS,SEPTRA DS) 800-160 MG tablet, TK 1 T PO BID FOR 10 DAYS, Disp: , Rfl:   No Known Allergies Review of Systems: Negative except as noted in the HPI. Denies N/V/F/Ch. Objective:  There were no vitals filed for this visit. General AA&O x3. Normal mood and affect.  Vascular Dorsalis pedis and posterior tibial pulses  present 2+ bilaterally    Capillary refill normal to all digits. Pedal hair growth normal.  Neurologic Epicritic sensation grossly diminished.  Dermatologic No open lesions. Interspaces clear of maceration. Nails well groomed and normal in appearance. Pre-ulcerative callus R Hallux IPJ   Orthopedic: UTA 2/2 quadraplegia.   Assessment & Plan:  Patient was evaluated and treated and all questions answered.  R 1st MPJ Callus Pre-Ulcerative -Debrided as below. -Tube foam dispensed for off-loading.  Procedure: Paring of Lesion Rationale: painful hyperkeratotic lesion Type of Debridement: manual, sharp debridement. Instrumentation: 312 blade Number of Lesions: 1     Return in about 1 month (around 04/06/2018).

## 2018-04-06 ENCOUNTER — Ambulatory Visit (INDEPENDENT_AMBULATORY_CARE_PROVIDER_SITE_OTHER): Payer: Medicaid Other | Admitting: Podiatry

## 2018-04-06 DIAGNOSIS — Q828 Other specified congenital malformations of skin: Secondary | ICD-10-CM

## 2018-04-06 DIAGNOSIS — D492 Neoplasm of unspecified behavior of bone, soft tissue, and skin: Secondary | ICD-10-CM | POA: Diagnosis not present

## 2018-04-06 NOTE — Progress Notes (Signed)
  Subjective:  Patient ID: Taylor Fernandez, male    DOB: 11/13/1995,  MRN: [id number]  Chief Complaint  Patient presents with  . Callouses    4 week check, callus right great toe   22 y.o. male returns for the above complaint. Mom is using cream and states it helps. Has been holding off PT.  Objective:  There were no vitals filed for this visit. General AA&O x3. Normal mood and affect.  Vascular Pedal pulses palpable.  Neurologic Epicritic sensation grossly intact.  Dermatologic No open lesions. Skin normal texture and turgor. Hyperkeratosis plantar right hallux. No open ulceration. Pre-ulcerative.  Orthopedic: No pain to palpation either foot.   Assessment & Plan:  Patient was evaluated and treated and all questions answered.  L Hallux Pre-ulcerative callus -Improved. Gently debrided today with 312 blade. -Continue tube foam  No follow-ups on file.

## 2018-04-19 ENCOUNTER — Encounter: Payer: Self-pay | Admitting: Rehabilitation

## 2018-04-19 ENCOUNTER — Ambulatory Visit: Payer: Medicaid Other | Attending: Family Medicine | Admitting: Rehabilitation

## 2018-04-19 DIAGNOSIS — G8112 Spastic hemiplegia affecting left dominant side: Secondary | ICD-10-CM | POA: Diagnosis present

## 2018-04-19 DIAGNOSIS — R2681 Unsteadiness on feet: Secondary | ICD-10-CM | POA: Diagnosis present

## 2018-04-19 DIAGNOSIS — M6281 Muscle weakness (generalized): Secondary | ICD-10-CM

## 2018-04-19 DIAGNOSIS — R2689 Other abnormalities of gait and mobility: Secondary | ICD-10-CM

## 2018-04-19 DIAGNOSIS — G8113 Spastic hemiplegia affecting right nondominant side: Secondary | ICD-10-CM

## 2018-04-19 NOTE — Therapy (Signed)
[facility] [address] [place], [place], [address] Phone: [phone]   Fax:  [phone]  Physical Therapy Evaluation  Patient Details  Name: Taylor Fernandez MRN: [id number] Date of Birth: 11-30-1995 Referring Provider: Dineen Kid, MD   Encounter Date: 04/19/2018  PT End of Session - 04/19/18 1246    Visit Number  1    Number of Visits  24    Date for PT Re-Evaluation  07/18/18    Authorization Type  Medicaid (awaiting approval)    PT Start Time  0931    PT Stop Time  1017    PT Time Calculation (min)  46 min    Equipment Utilized During Treatment  Gait belt    Activity Tolerance  Patient tolerated treatment well    Behavior During Therapy  [facility] for tasks assessed/performed       Past Medical History:  Diagnosis Date  . Cerebral palsy ([place])   . Developmental delay   . Wheelchair bound    Can not stand     Past Surgical History:  Procedure Laterality Date  . EYE MUSCLE SURGERY     22 yrs old and 78-10 yrs old  . KNEE SURGERY     Tendons extended on both legs  . STRABISMUS SURGERY Bilateral 04/03/2015   Procedure: REPAIR STRABISMUS PEDIATRIC;  Surgeon: Everitt Amber, MD;  Location: [facility];  Service: Ophthalmology;  Laterality: Bilateral;    There were no vitals filed for this visit.   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 Mother notes that he is able to scoot to EOB and mother assists with walking in apt.  They are unable to use power chair in apt due to size of apt.      Patient is accompained by:  Family member [place]    Pertinent History  CP (Goes to [employer] 5 days per week 8-11am)    Limitations  House hold activities;Walking;Standing    How long can you stand comfortably?  Is standing for approx 1 hour per day via w/c that is also stander to do school work.     Patient Stated Goals  "To do more for  myself."    Currently in Pain?  No/denies         [facility] PT Assessment - 04/19/18 0944      Assessment   Medical Diagnosis  CP    Referring Provider  Dineen Kid, MD    Onset Date/Surgical Date  -- CP from birth    Prior Therapy  Previous PT/OT at [facility], then neuro OT last year      Precautions   Precautions  Fall    Required Braces or Orthoses  Other Brace/Splint B AFOs      Restrictions   Weight Bearing Restrictions  No      Balance Screen   Has the patient fallen in the past 6 months  No    Has the patient had a decrease in activity level because of a fear of falling?   No    Is the patient reluctant to leave their home because of a fear of falling?   No      Home Social worker  Private residence    Living Arrangements  Parent    Available Help at Discharge  Family;Available PRN/intermittently Stays with father in evenings and night  Type of Home  Apartment    Home Access  Level entry    Home Layout  One level    [place] - 2 wheels;Shower seat has platforms for [employer], power chair     Additional Comments  They have another shower chair but doesn't work well for them.  Mother assists onto chair from outside and assists LEs inside.       Prior Function   Level of Independence  Needs assistance with ADLs;Needs assistance with gait;Needs assistance with transfers    Leisure  Go outside, likes baseball (plays with [employer] league)      Cognition   Overall Cognitive Status  History of cognitive impairments - at baseline      Sensation   Light Touch  Appears Intact      Coordination   Gross Motor Movements are Fluid and Coordinated  No    Fine Motor Movements are Fluid and Coordinated  No    Heel Shin Test  limited due to decreased strength and tone      ROM / Strength   AROM / PROM / Strength  Strength;AROM      AROM   Overall AROM   Deficits    Overall AROM Comments  Pt with limited flexibility in LEs, esp hips.  Pts mother  reports they are doing supine stretching program at home.       Strength   Overall Strength  Deficits    Overall Strength Comments  Difficult to formally assessed due to cognitive deficits, however pt demos 3-/5 in hip/knee flexion, 4/5 hip extension.  Note increased tone in LLE during testing and more weakness in RLE.  However pt with increased tone in RUE.  Will continue to assess functionally.       Bed Mobility   Bed Mobility  Supine to Sit;Sit to Supine    Supine to Sit  Minimal Assistance - Patient > 75%    Sit to Supine  Minimal Assistance - Patient > 75%      Transfers   Transfers  Sit to Stand;Stand to Sit;Stand Pivot Transfers    Sit to Stand  4: Min assist;3: Mod assist    Sit to Stand Details  Verbal cues for sequencing;Verbal cues for technique;Verbal cues for precautions/safety;Manual facilitation for weight shifting;Manual facilitation for weight bearing    Sit to Stand Details (indicate cue type and reason)  Had pt's mother assist pt as they do at home to better assess his mobility.  Pts mother grabs pts hands and guides into standing.  Note that he tends to keep LEs extended and therefore likely needs more assist in this manner.      Stand to Sit  4: Min assist    Stand to Sit Details (indicate cue type and reason)  Verbal cues for sequencing;Verbal cues for technique;Verbal cues for precautions/safety;Manual facilitation for weight shifting;Manual facilitation for weight bearing    Stand to Sit Details  Pt tends to keep trunk very stiff with little flexion, however following second bout of gait, it was much improved esp with facilitation from PT.     Stand Pivot Transfers  4: Min assist;3: Mod assist    Stand Pivot Transfer Details (indicate cue type and reason)  Pts mother changes hand placement from holding pts hands to guiding him from trunk with cues for stepping sequence.        Ambulation/Gait   Ambulation/Gait  Yes    Ambulation/Gait Assistance  4: Min  assist;3: Mod  assist    Ambulation/Gait Assistance Details  Had pts mother assist pt as they do at home to better assess mobility.  Pts mother holds his hands and faces pt while ambulating with him.  Note that their seems to be a leg length discrepancy (LLE shorter than RLE?) but will further assess.  Also note increased tone in LLE along with scissored gait pattern.  Pts mother states he has some sort of belt/LE brace that decreases scissoring.  Will bring to next session. Then assessed gait with use of B platform RW (they have this at home and have used in past).  Note that pt requires min A with this AD and some guiding assist for the walker.  Will continue to work on this in future sessions.     Ambulation Distance (Feet)  20 Feet x 2 reps    Assistive device  1 person hand held assist;Bilateral platform walker    Gait Pattern  Decreased arm swing - right;Decreased arm swing - left;Decreased stride length;Decreased dorsiflexion - left;Decreased weight shift to right;Decreased dorsiflexion - right;Lateral hip instability;Trunk flexed;Narrow base of support;Scissoring    Ambulation Surface  Level;Indoor                Objective measurements completed on examination: See above findings.              PT Education - 04/19/18 1244    Education Details  evaluation findings, POC, goals    Person(s) Educated  Patient;Parent(s)    Methods  Explanation    Comprehension  Verbalized understanding       PT Short Term Goals - 04/19/18 1914      PT SHORT TERM GOAL #1   Title  Pt/mother will demonstrate understanding of HEP in order to indicate improved functional mobility and improved flexibility.  (Target Date: 06/03/18)    Baseline  dependent     Time  6    Period  Weeks    Status  New    Target Date  06/03/18      PT SHORT TERM GOAL #2   Title  Pt will perform sit<>stand at S level in order to increase independence with ADLs.     Baseline  min to mod A     Time  6    Period  Weeks     Status  New      PT SHORT TERM GOAL #3   Title  Pt will perform stand pivot transfers at min/guard level in order to indicate improved functional mobility.      Baseline  min to mod A    Time  6    Period  Weeks    Status  New      PT SHORT TERM GOAL #4   Title  Pt will be referred to PM&R for tone management as able to improve functional mobility.      Baseline  currently only has PCP    Time  6    Period  Weeks    Status  New      PT SHORT TERM GOAL #5   Title  Pt will ambulate x 30' w/ LRAD at min A level in order to indicate improved functional mobility in home.      Baseline  min to mod A for 20' with BPFRW    Time  6    Period  Weeks    Status  New      Additional Short  Term Goals   Additional Short Term Goals  Yes      PT SHORT TERM GOAL #6   Title  Pt will tolerate standing (at counter) without use of power chair with BUE support x 5 mins in order to increase independence with ADLs.     Baseline  works on standing tolerance from power w/c in [place] position.     Time  6    Period  Weeks    Status  New        PT Long Term Goals - 04/19/18 1919      PT LONG TERM GOAL #1   Title  Pt will perform stand pivot transfer w/ LRAD at S level in order to increase independence at home. (Target Date: 07/18/18)    Baseline  min to mod A    Time  12    Period  Weeks    Status  New    Target Date  07/18/18      PT LONG TERM GOAL #2   Title  Pt will ambulate x 36' w/ LRAD at min/guard level in order to indicate improved functional independence in home.     Baseline  min to mod A for 20'     Time  12    Period  Weeks    Status  New      PT LONG TERM GOAL #3   Title  Pt will tolerate 10 mins of standing with BUE support (without use of stander) in order to improve independence with ADLs.     Baseline  Only standing with use of power chair/stander at home    Time  12    Period  Weeks    Status  New      PT LONG TERM GOAL #4   Title  Pt/mother will be independent with  final HEP in order to indicate improved functional mobility and decreased fall risk.      Baseline  dependent     Time  12    Period  Weeks    Status  New             Plan - 04/19/18 1249    Clinical Impression Statement  Pt presents w/ congenital cerebral palsy with spastic quadriplegia.  Note that he had a Baclofen pump previously (last year) however was not helping, therefore was removed in Feb 2019.  He presents with B AFOs and mother reports she assists him with standing and gait.  Has power w/c which also acts as stander and he stands for approx 1 hour per day while doing school work.  Upon PT evaluation, note that PT had mother assist with transfer w/c (manual) to mat and short bout of gait to assess how they perform at home.  He requires min to mod A for most mobility.  They do have a B PFRW at home and therefore briefly assessed this during session.  Pt demos ability/potential to ambulate with this device at home.  Would like to continue to assess tone and leg length during functional mobility.  Pt will benefit form skilled OP neuro PT in order to address deficits.      History and Personal Factors relevant to plan of care:  congenital CP with spastic quadriparesis    Clinical Presentation  Evolving    Clinical Presentation due to:  see above    Clinical Decision Making  Moderate    Rehab Potential  Good    Clinical Impairments Affecting  Rehab Potential  severity of deficits    PT Frequency  2x / week    PT Duration  12 weeks    PT Treatment/Interventions  ADLs/Self Care Home Management;Aquatic Therapy;DME Instruction;Gait training;Stair training;Functional mobility training;Therapeutic activities;Therapeutic exercise;Balance training;Neuromuscular re-education;Patient/family education;Orthotic Fit/Training;Passive range of motion    PT Next Visit Plan  Assess home stretching program and add/update as needed to reduce tone, see if got referral to PM&R, transitional movements, gait  with BPFWR (can we just use R PFRW), assess for leg length discrepancy    Consulted and Agree with Plan of Care  Patient;Family member/caregiver    Family Member Consulted  mother       Patient will benefit from skilled therapeutic intervention in order to improve the following deficits and impairments:  Abnormal gait, Decreased balance, Decreased coordination, Decreased endurance, Decreased knowledge of use of DME, Decreased mobility, Decreased range of motion, Decreased strength, Impaired perceived functional ability, Impaired flexibility, Impaired tone, Impaired UE functional use, Postural dysfunction  Visit Diagnosis: Spastic hemiplegia affecting left dominant side, unspecified etiology (HCC)  Spastic hemiplegia affecting right nondominant side, unspecified etiology ([place])  Unsteadiness on feet  Muscle weakness (generalized)  Other abnormalities of gait and mobility     Problem List Patient Active Problem List   Diagnosis Date Noted  . Reactive airway disease 11/09/2017  . 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 Congenital quadriplegia ([place]) 01/05/2012    Cameron Sprang, PT, MPT [facility] [address] [place], [place], [address] Phone: [phone]   Fax:  [phone] 04/19/18, 7:30 PM  Name: Taylor Fernandez MRN: [id number] Date of Birth: Dec 27, 1995

## 2018-05-02 ENCOUNTER — Ambulatory Visit: Payer: Medicaid Other | Admitting: Physical Therapy

## 2018-05-03 ENCOUNTER — Ambulatory Visit: Payer: Medicaid Other | Admitting: Rehabilitation

## 2018-05-08 ENCOUNTER — Ambulatory Visit: Payer: Medicaid Other | Admitting: Physical Therapy

## 2018-05-11 ENCOUNTER — Ambulatory Visit: Payer: Medicaid Other | Admitting: Physical Therapy

## 2018-05-14 ENCOUNTER — Ambulatory Visit: Payer: Medicaid Other | Admitting: Physical Therapy

## 2018-05-15 ENCOUNTER — Ambulatory Visit: Payer: Medicaid Other | Admitting: Rehabilitation

## 2018-05-18 ENCOUNTER — Ambulatory Visit: Payer: Medicaid Other | Attending: Family Medicine | Admitting: Rehabilitation

## 2018-05-18 ENCOUNTER — Encounter: Payer: Self-pay | Admitting: Rehabilitation

## 2018-05-18 DIAGNOSIS — R2689 Other abnormalities of gait and mobility: Secondary | ICD-10-CM | POA: Diagnosis present

## 2018-05-18 DIAGNOSIS — G8113 Spastic hemiplegia affecting right nondominant side: Secondary | ICD-10-CM | POA: Diagnosis present

## 2018-05-18 DIAGNOSIS — R2681 Unsteadiness on feet: Secondary | ICD-10-CM | POA: Diagnosis present

## 2018-05-18 DIAGNOSIS — M6281 Muscle weakness (generalized): Secondary | ICD-10-CM | POA: Insufficient documentation

## 2018-05-18 DIAGNOSIS — G8112 Spastic hemiplegia affecting left dominant side: Secondary | ICD-10-CM | POA: Diagnosis not present

## 2018-05-18 NOTE — Therapy (Signed)
[facility] [address] [place], [place], [address] Phone: [phone]   Fax:  [phone]  Physical Therapy Treatment  Patient Details  Name: Taylor Fernandez MRN: [id number] Date of Birth: 02-05-1996 Referring Provider: Dineen Kid, MD   Encounter Date: 05/18/2018  PT End of Session - 05/18/18 1244    Visit Number  2    Number of Visits  24    Date for PT Re-Evaluation  07/18/18    Authorization Type  Medicaid (approved 24 visits from 7/24-10/15)    Authorization - Visit Number  1    Authorization - Number of Visits  24    PT Start Time  [id number]    PT Stop Time  0930    PT Time Calculation (min)  38 min    Equipment Utilized During Treatment  Gait belt    Activity Tolerance  Patient tolerated treatment well    Behavior During Therapy  [facility] for tasks assessed/performed       Past Medical History:  Diagnosis Date  . Cerebral palsy ([place])   . Developmental delay   . Wheelchair bound    Can not stand     Past Surgical History:  Procedure Laterality Date  . EYE MUSCLE SURGERY     22 yrs old and 50-10 yrs old  . KNEE SURGERY     Tendons extended on both legs  . STRABISMUS SURGERY Bilateral 04/03/2015   Procedure: REPAIR STRABISMUS PEDIATRIC;  Surgeon: Everitt Amber, MD;  Location: [facility];  Service: Ophthalmology;  Laterality: Bilateral;    There were no vitals filed for this visit.  Subjective Assessment - 05/18/18 0858    Subjective  Pt reports having many appts and that is why he has not been in since eval.  Also note that he has gait trainer at home in addition to B PFRW that he is trying to get updated due to current one too small.     Patient is accompained by:  Family member    Pertinent History  CP (Goes to [employer] 5 days per week 8-11am)    Limitations  House hold activities;Walking;Standing    How long can you stand comfortably?  Is standing for approx 1 hour per day via w/c that is also  stander to do school work.     Patient Stated Goals  "To do more for myself."    Currently in Pain?  No/denies                       [facility] Adult PT Treatment/Exercise - 05/18/18 1232      Bed Mobility   Bed Mobility  Supine to Sit;Sit to Supine    Supine to Sit  Minimal Assistance - Patient > 75%    Sit to Supine  Minimal Assistance - Patient > 75%      Transfers   Transfers  Sit to Stand;Stand to Sit;Supine to Sit;Sit to Supine    Sit to Stand  4: Min assist;With upper extremity assist    Sit to Stand Details  Verbal cues for sequencing;Verbal cues for technique;Manual facilitation for weight shifting;Manual facilitation for weight bearing    Sit to Stand Details (indicate cue type and reason)  HHA when assisting to stand from w/c.  however when standing to RW, had pt reach for RW in order to increase forward weight shift.      Stand to Sit  4: Min assist  Stand to Sit Details (indicate cue type and reason)  Verbal cues for sequencing;Verbal cues for technique;Verbal cues for precautions/safety;Manual facilitation for weight shifting;Manual facilitation for weight bearing    Stand to Sit Details  cues for reaching back.  Did note improved trunk flexion today vs previous session.      Stand Pivot Transfers  4: Min assist    Stand Pivot Transfer Details (indicate cue type and reason)  Cues for stepping sequence and scooting hips onto mat once seated.       Ambulation/Gait   Ambulation/Gait  Yes    Ambulation/Gait Assistance  4: Min assist;3: Mod assist    Ambulation/Gait Assistance Details  Had pt ambulate with R PFRW during session as PT felt he would be able to get LUE on regular hand grip.  He was able to do so safely and without pain.  He ambulated x 30' with min/mod A with facilitation for improved forward weight shift and also assist for LLE placement (BLE adduct with narrow BOS, however felt that PT could better assist with placement of LLE).  Cues for upright  posture throughtout and also for improved L lateral/forward weight shift prior to advancing RLE as he tends to not fully load LLE prior to advancing RLE>      Ambulation Distance (Feet)  70 Feet    Assistive device  Right platform walker    Gait Pattern  Decreased arm swing - right;Decreased arm swing - left;Decreased stride length;Decreased dorsiflexion - left;Decreased weight shift to right;Decreased dorsiflexion - right;Lateral hip instability;Trunk flexed;Narrow base of support;Scissoring    Ambulation Surface  Level;Indoor    Gait Comments  Pt demo's very slow gait speed and needing several standing rest breaks, but was able to continue for 70' without sitting.       Neuro Re-ed    Neuro Re-ed Details   Exercises and stretches to reduce tone prior to gait:  knee to hip x 10 reps with single 30 sec hold BLE, Hip IR/ER x 10 reps with stretch to IR x 30 secs BLE, hooklying lower pelvic rotation x 10 reps.  Pts mother demo'd how they are doing trunk stretching with hooklying with knees away from mother while she brings shoulders/trunk in opposite direction  x  30 secs.  Recommended continuing this stretch.  They report doing other stretches except for IR/ER, therefore verbally added to HEP.              PT Education - 05/18/18 1243    Education Details  Discussion about [name] from [employer] coming to home for a new gait trainer.  Educated on use of platform RW as well and stretches/ROM to reduce tone x 2 per day (morning and evening).     Person(s) Educated  Patient;Parent(s)    Methods  Explanation;Demonstration    Comprehension  Verbalized understanding;Returned demonstration       PT Short Term Goals - 04/19/18 1914      PT SHORT TERM GOAL #1   Title  Pt/mother will demonstrate understanding of HEP in order to indicate improved functional mobility and improved flexibility.  (Target Date: 06/03/18)    Baseline  dependent     Time  6    Period  Weeks    Status  New    Target Date   06/03/18      PT SHORT TERM GOAL #2   Title  Pt will perform sit<>stand at S level in order to increase independence with ADLs.  Baseline  min to mod A     Time  6    Period  Weeks    Status  New      PT SHORT TERM GOAL #3   Title  Pt will perform stand pivot transfers at min/guard level in order to indicate improved functional mobility.      Baseline  min to mod A    Time  6    Period  Weeks    Status  New      PT SHORT TERM GOAL #4   Title  Pt will be referred to PM&R for tone management as able to improve functional mobility.      Baseline  currently only has PCP    Time  6    Period  Weeks    Status  New      PT SHORT TERM GOAL #5   Title  Pt will ambulate x 30' w/ LRAD at min A level in order to indicate improved functional mobility in home.      Baseline  min to mod A for 20' with BPFRW    Time  6    Period  Weeks    Status  New      Additional Short Term Goals   Additional Short Term Goals  Yes      PT SHORT TERM GOAL #6   Title  Pt will tolerate standing (at counter) without use of power chair with BUE support x 5 mins in order to increase independence with ADLs.     Baseline  works on standing tolerance from power w/c in [place] position.     Time  6    Period  Weeks    Status  New        PT Long Term Goals - 04/19/18 1919      PT LONG TERM GOAL #1   Title  Pt will perform stand pivot transfer w/ LRAD at S level in order to increase independence at home. (Target Date: 07/18/18)    Baseline  min to mod A    Time  12    Period  Weeks    Status  New    Target Date  07/18/18      PT LONG TERM GOAL #2   Title  Pt will ambulate x 38' w/ LRAD at min/guard level in order to indicate improved functional independence in home.     Baseline  min to mod A for 20'     Time  12    Period  Weeks    Status  New      PT LONG TERM GOAL #3   Title  Pt will tolerate 10 mins of standing with BUE support (without use of stander) in order to improve independence with  ADLs.     Baseline  Only standing with use of power chair/stander at home    Time  12    Period  Weeks    Status  New      PT LONG TERM GOAL #4   Title  Pt/mother will be independent with final HEP in order to indicate improved functional mobility and decreased fall risk.      Baseline  dependent     Time  12    Period  Weeks    Status  New            Plan - 05/18/18 1245    Clinical Impression Statement  Skilled session reviewed recommended  stretching/ROM program for home to reduce tone prior to mobility. Pt mother report they are doing all but one, but will add this exercise.  Also address gait with R PFRW only vs B and this worked well.  Note increased adductor tone and briefly discussed that he may benefit from botox to adductors.  Will follow up.     Rehab Potential  Good    Clinical Impairments Affecting Rehab Potential  severity of deficits    PT Frequency  2x / week    PT Duration  12 weeks    PT Treatment/Interventions  ADLs/Self Care Home Management;Aquatic Therapy;DME Instruction;Gait training;Stair training;Functional mobility training;Therapeutic activities;Therapeutic exercise;Balance training;Neuromuscular re-education;Patient/family education;Orthotic Fit/Training;Passive range of motion    PT Next Visit Plan  continue to discuss possible botox to adductors, transitional movements, gait with RPFRW, assess for leg length discrepancy    Consulted and Agree with Plan of Care  Patient;Family member/caregiver    Family Member Consulted  mother       Patient will benefit from skilled therapeutic intervention in order to improve the following deficits and impairments:  Abnormal gait, Decreased balance, Decreased coordination, Decreased endurance, Decreased knowledge of use of DME, Decreased mobility, Decreased range of motion, Decreased strength, Impaired perceived functional ability, Impaired flexibility, Impaired tone, Impaired UE functional use, Postural  dysfunction  Visit Diagnosis: Spastic hemiplegia affecting left dominant side, unspecified etiology (HCC)  Spastic hemiplegia affecting right nondominant side, unspecified etiology ([place])  Unsteadiness on feet  Muscle weakness (generalized)  Other abnormalities of gait and mobility     Problem List Patient Active Problem List   Diagnosis Date Noted  . Reactive airway disease 11/09/2017  . 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 Congenital quadriplegia ([place]) 01/05/2012    Cameron Sprang, PT, MPT [facility] [address] [place], [place], [address] Phone: [phone]   Fax:  [phone] 05/18/18, 12:48 PM  Name: ELLERY MERONEY MRN: [id number] Date of Birth: August 04, 1996

## 2018-05-22 ENCOUNTER — Ambulatory Visit: Payer: Medicaid Other | Admitting: Rehabilitation

## 2018-05-24 ENCOUNTER — Encounter: Payer: Self-pay | Admitting: Rehabilitation

## 2018-05-24 ENCOUNTER — Ambulatory Visit: Payer: Medicaid Other | Admitting: Rehabilitation

## 2018-05-24 DIAGNOSIS — G8112 Spastic hemiplegia affecting left dominant side: Secondary | ICD-10-CM | POA: Diagnosis not present

## 2018-05-24 DIAGNOSIS — R2689 Other abnormalities of gait and mobility: Secondary | ICD-10-CM

## 2018-05-24 DIAGNOSIS — G8113 Spastic hemiplegia affecting right nondominant side: Secondary | ICD-10-CM

## 2018-05-24 DIAGNOSIS — M6281 Muscle weakness (generalized): Secondary | ICD-10-CM

## 2018-05-24 DIAGNOSIS — R2681 Unsteadiness on feet: Secondary | ICD-10-CM

## 2018-05-24 NOTE — Therapy (Signed)
[facility] [address] [place], [place], [address] Phone: [phone]   Fax:  [phone]  Physical Therapy Treatment  Patient Details  Name: Taylor Fernandez MRN: [id number] Date of Birth: Mar 10, 1996 Referring Provider: Dineen Kid, MD   Encounter Date: 05/24/2018  PT End of Session - 05/24/18 1223    Visit Number  3    Number of Visits  24    Date for PT Re-Evaluation  07/18/18    Authorization Type  Medicaid (approved 24 visits from 7/24-10/15)    Authorization - Visit Number  2    Authorization - Number of Visits  24    PT Start Time  [phone]    PT Stop Time  0933    PT Time Calculation (min)  40 min    Equipment Utilized During Treatment  Gait belt    Activity Tolerance  Patient tolerated treatment well    Behavior During Therapy  [facility] for tasks assessed/performed       Past Medical History:  Diagnosis Date  . Cerebral palsy ([place])   . Developmental delay   . Wheelchair bound    Can not stand     Past Surgical History:  Procedure Laterality Date  . EYE MUSCLE SURGERY     22 yrs old and [age] yrs old  . KNEE SURGERY     Tendons extended on both legs  . STRABISMUS SURGERY Bilateral 04/03/2015   Procedure: REPAIR STRABISMUS PEDIATRIC;  Surgeon: Everitt Amber, MD;  Location: [facility];  Service: Ophthalmology;  Laterality: Bilateral;    There were no vitals filed for this visit.  Subjective Assessment - 05/24/18 0856    Subjective  No changes.  Did get new standard W/c that has better brakes.     Patient is accompained by:  Family member    Pertinent History  CP (Goes to [employer] 5 days per week 8-11am)    Limitations  House hold activities;Walking;Standing    How long can you stand comfortably?  Is standing for approx 1 hour per day via w/c that is also stander to do school work.     Patient Stated Goals  "To do more for myself."    Currently in Pain?  No/denies                        [facility] Adult PT Treatment/Exercise - 05/24/18 0855      Transfers   Transfers  Sit to Stand;Stand to Sit;Stand Pivot Transfers    Sit to Stand  4: Min assist;With upper extremity assist;From chair/3-in-1    Sit to Stand Details  Verbal cues for sequencing;Verbal cues for technique;Manual facilitation for weight shifting;Manual facilitation for weight bearing    Sit to Stand Details (indicate cue type and reason)  HHA from w/c x 5-6 reps during session with cues for forward weight shift, scooting to EOC and use of LUE on chair.     Stand to Sit  4: Min assist    Stand to Sit Details (indicate cue type and reason)  Verbal cues for sequencing;Verbal cues for technique;Verbal cues for precautions/safety;Manual facilitation for weight shifting;Manual facilitation for weight bearing    Stand to Sit Details  cues for forward trunk lean and controlled descent.     Stand Pivot Transfers  3: Mod assist    Stand Pivot Transfer Details (indicate cue type and reason)  Pt unable to shift hips over RLE, therefore needed increased  assist today for weight shift and transfer.       Ambulation/Gait   Ambulation/Gait  Yes    Ambulation/Gait Assistance  4: Min assist;3: Mod assist    Ambulation/Gait Assistance Details  Note more upright posture today during session (did gait following NMR) with cues for foot placement.  Needs assist for RLE placement today due to increased tone and narrowed BOS.  Assist/facilitation for forward weight shift onto stance leg with cues to not step opposite extremity until fully on stance leg.      Ambulation Distance (Feet)  20 Feet    Assistive device  Right platform walker    Gait Pattern  Decreased arm swing - right;Decreased arm swing - left;Decreased stride length;Decreased dorsiflexion - left;Decreased weight shift to right;Decreased dorsiflexion - right;Lateral hip instability;Trunk flexed;Narrow base of support;Scissoring    Ambulation Surface   Level;Indoor      Neuro Re-ed    Neuro Re-ed Details   NMR to work on trunk dissociation and improved R lateral weight shift in standing; sitting on green balance disc with UE and LE support initially trying to get pt to shift R and L.  Progressed to having pt reach upward and to the L for improved trunk lengthening on L/shortening on R and then vice versa.  Note pt is able to dissociate with multiple reps and cues and has easier time with trunk mobility to the R.  Eventually progressed task to not having LE support and UE support from therapist to allow decreased compensation.  Progressed to standing tasks at mirror in order to address upright posture and improving R lateral weight shift.  Pt with increased tone during task, however with facilitation is able to come out of L weight shift R lateral trunk flexed posture, but has difficulty maintaining.  Performed standing x 5 reps in this manner with having pt work on "bumping" therapist for improved R lateral weight shift.                PT Short Term Goals - 04/19/18 1914      PT SHORT TERM GOAL #1   Title  Pt/mother will demonstrate understanding of HEP in order to indicate improved functional mobility and improved flexibility.  (Target Date: 06/03/18)    Baseline  dependent     Time  6    Period  Weeks    Status  New    Target Date  06/03/18      PT SHORT TERM GOAL #2   Title  Pt will perform sit<>stand at S level in order to increase independence with ADLs.     Baseline  min to mod A     Time  6    Period  Weeks    Status  New      PT SHORT TERM GOAL #3   Title  Pt will perform stand pivot transfers at min/guard level in order to indicate improved functional mobility.      Baseline  min to mod A    Time  6    Period  Weeks    Status  New      PT SHORT TERM GOAL #4   Title  Pt will be referred to PM&R for tone management as able to improve functional mobility.      Baseline  currently only has PCP    Time  6    Period   Weeks    Status  New      PT SHORT  TERM GOAL #5   Title  Pt will ambulate x 30' w/ LRAD at min A level in order to indicate improved functional mobility in home.      Baseline  min to mod A for 20' with BPFRW    Time  6    Period  Weeks    Status  New      Additional Short Term Goals   Additional Short Term Goals  Yes      PT SHORT TERM GOAL #6   Title  Pt will tolerate standing (at counter) without use of power chair with BUE support x 5 mins in order to increase independence with ADLs.     Baseline  works on standing tolerance from power w/c in [place] position.     Time  6    Period  Weeks    Status  New        PT Long Term Goals - 04/19/18 1919      PT LONG TERM GOAL #1   Title  Pt will perform stand pivot transfer w/ LRAD at S level in order to increase independence at home. (Target Date: 07/18/18)    Baseline  min to mod A    Time  12    Period  Weeks    Status  New    Target Date  07/18/18      PT LONG TERM GOAL #2   Title  Pt will ambulate x 34' w/ LRAD at min/guard level in order to indicate improved functional independence in home.     Baseline  min to mod A for 20'     Time  12    Period  Weeks    Status  New      PT LONG TERM GOAL #3   Title  Pt will tolerate 10 mins of standing with BUE support (without use of stander) in order to improve independence with ADLs.     Baseline  Only standing with use of power chair/stander at home    Time  12    Period  Weeks    Status  New      PT LONG TERM GOAL #4   Title  Pt/mother will be independent with final HEP in order to indicate improved functional mobility and decreased fall risk.      Baseline  dependent     Time  12    Period  Weeks    Status  New            Plan - 05/24/18 1223    Clinical Impression Statement  Skilled session focused on improving trunk mobility and trunk dissociation to decrease tone and improve mobility to carryover to improved gait and standing.  Note mild improvement during  session.      Rehab Potential  Good    Clinical Impairments Affecting Rehab Potential  severity of deficits    PT Frequency  2x / week    PT Duration  12 weeks    PT Treatment/Interventions  ADLs/Self Care Home Management;Aquatic Therapy;DME Instruction;Gait training;Stair training;Functional mobility training;Therapeutic activities;Therapeutic exercise;Balance training;Neuromuscular re-education;Patient/family education;Orthotic Fit/Training;Passive range of motion    PT Next Visit Plan  continue to discuss possible botox to adductors, transitional movements, gait with RPFRW, assess for leg length discrepancy    Consulted and Agree with Plan of Care  Patient;Family member/caregiver    Family Member Consulted  mother       Patient will benefit from skilled therapeutic intervention in order  to improve the following deficits and impairments:  Abnormal gait, Decreased balance, Decreased coordination, Decreased endurance, Decreased knowledge of use of DME, Decreased mobility, Decreased range of motion, Decreased strength, Impaired perceived functional ability, Impaired flexibility, Impaired tone, Impaired UE functional use, Postural dysfunction  Visit Diagnosis: Spastic hemiplegia affecting left dominant side, unspecified etiology (HCC)  Spastic hemiplegia affecting right nondominant side, unspecified etiology ([place])  Unsteadiness on feet  Muscle weakness (generalized)  Other abnormalities of gait and mobility     Problem List Patient Active Problem List   Diagnosis Date Noted  . Reactive airway disease 11/09/2017  . 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 Congenital quadriplegia ([place]) 01/05/2012    Cameron Sprang, PT, MPT [facility] [address] [place], [place], [address] Phone: [phone]   Fax:  [phone] 05/24/18, 12:25 PM  Name: DERELLE COCKRELL MRN: [id number] Date of Birth: [birth year]

## 2018-05-25 ENCOUNTER — Ambulatory Visit: Payer: Medicaid Other | Admitting: Rehabilitation

## 2018-05-29 ENCOUNTER — Encounter: Payer: Self-pay | Admitting: Physical Therapy

## 2018-05-29 ENCOUNTER — Ambulatory Visit: Payer: Medicaid Other | Admitting: Physical Therapy

## 2018-05-29 DIAGNOSIS — R2681 Unsteadiness on feet: Secondary | ICD-10-CM

## 2018-05-29 DIAGNOSIS — G8113 Spastic hemiplegia affecting right nondominant side: Secondary | ICD-10-CM

## 2018-05-29 DIAGNOSIS — G8112 Spastic hemiplegia affecting left dominant side: Secondary | ICD-10-CM | POA: Diagnosis not present

## 2018-05-29 DIAGNOSIS — M6281 Muscle weakness (generalized): Secondary | ICD-10-CM

## 2018-05-29 NOTE — Therapy (Signed)
[facility] [address] [place], [place], [address] Phone: [phone]   Fax:  [phone]  Physical Therapy Treatment  Patient Details  Name: Taylor Fernandez MRN: [id number] Date of Birth: 1996-02-14 Referring Provider: Dineen Kid, MD   Encounter Date: 05/29/2018  PT End of Session - 05/29/18 1258    Visit Number  4    Number of Visits  24    Date for PT Re-Evaluation  07/18/18    Authorization Type  Medicaid (approved 24 visits from 7/24-10/15)    Authorization - Visit Number  3    Authorization - Number of Visits  24    PT Start Time  0845    PT Stop Time  0933    PT Time Calculation (min)  48 min    Activity Tolerance  Patient tolerated treatment well    Behavior During Therapy  [facility] for tasks assessed/performed       Past Medical History:  Diagnosis Date  . Cerebral palsy ([place])   . Developmental delay   . Wheelchair bound    Can not stand     Past Surgical History:  Procedure Laterality Date  . EYE MUSCLE SURGERY     22 yrs old and 20-10 yrs old  . KNEE SURGERY     Tendons extended on both legs  . STRABISMUS SURGERY Bilateral 04/03/2015   Procedure: REPAIR STRABISMUS PEDIATRIC;  Surgeon: Everitt Amber, MD;  Location: [facility];  Service: Ophthalmology;  Laterality: Bilateral;    There were no vitals filed for this visit.  Subjective Assessment - 05/29/18 0849    Subjective  No changes, has been going to school.  Mother has not been able to get in touch with [employer] yet but will keep trying.    Patient is accompained by:  Family member    Pertinent History  CP (Goes to [employer] 5 days per week 8-11am)    Limitations  House hold activities;Walking;Standing    How long can you stand comfortably?  Is standing for approx 1 hour per day via w/c that is also stander to do school work.     Patient Stated Goals  "To do more for myself."    Currently in Pain?  No/denies                        [facility] Adult PT Treatment/Exercise - 05/29/18 1222      Bed Mobility   Bed Mobility  Supine to Sit;Sit to Supine    Supine to Sit  Moderate Assistance - Patient 50-74%    Sit to Supine  Moderate Assistance - Patient 50-74%      Transfers   Transfers  Sit to Stand;Stand to Sit;Stand Pivot Transfers    Sit to Stand  4: Min assist;With upper extremity assist    Sit to Stand Details (indicate cue type and reason)  Sit to stand training from mat to improve patient's postural control to maintain trunk and COG in midline during sit <> stand.  Began with yoga block between knees during sit > stand to keep femurs in alignment with hips but pt experienced increased weight shift and pelvis shift to L.  Returned to sitting and performed multiple sit > stand with L foot wedged on 2" step to increase weight shift to RLE.  Once in standing performed active weight shifts of pelvis to to R and trunk to L and maintaining during stand > sit.  Pt performed with bilat UE support on therapist's shoulders.    Stand to Sit  4: Min assist    Stand Pivot Transfers  3: Mod assist    Stand Pivot Transfer Details (indicate cue type and reason)  Had pt maintain UE support on therapist's shoulders while performing stand step to R side from mat > w/c with focus on full weight shift of pelvis and trunk to L in order to advance RLE and then full weight shift of pelvis and trunk over RLE to advance LLE.  Pt required assistance to abduct and extend RLE      Neuro Re-ed    Neuro Re-ed Details   NMR to continue to focus on specific mm activation and motor planning as well as trunk dissociation.  In supine with bilat feet on ball performed 10 reps hamstring curls, 10 reps lower trunk rotation to L and R and 10 reps bridges with feet on mat with focus on hip extension instead of knee and trunk extension causing pt to slide up mat.  In sitting performed reaching forwards and to the L with UE supported on  ball for increased trunk elongation and excursion to L to prepare for sit > stand.        Exercises   Exercises  Knee/Hip      Knee/Hip Exercises: Stretches   Passive Hamstring Stretch  Right;Left;1 rep;60 seconds    Passive Hamstring Stretch Limitations  in supine    Other Knee/Hip Stretches  Adductor stretch to R and LLE; 1 rep; 60 seconds in supine               PT Short Term Goals - 04/19/18 1914      PT SHORT TERM GOAL #1   Title  Pt/mother will demonstrate understanding of HEP in order to indicate improved functional mobility and improved flexibility.  (Target Date: 06/03/18)    Baseline  dependent     Time  6    Period  Weeks    Status  New    Target Date  06/03/18      PT SHORT TERM GOAL #2   Title  Pt will perform sit<>stand at S level in order to increase independence with ADLs.     Baseline  min to mod A     Time  6    Period  Weeks    Status  New      PT SHORT TERM GOAL #3   Title  Pt will perform stand pivot transfers at min/guard level in order to indicate improved functional mobility.      Baseline  min to mod A    Time  6    Period  Weeks    Status  New      PT SHORT TERM GOAL #4   Title  Pt will be referred to PM&R for tone management as able to improve functional mobility.      Baseline  currently only has PCP    Time  6    Period  Weeks    Status  New      PT SHORT TERM GOAL #5   Title  Pt will ambulate x 30' w/ LRAD at min A level in order to indicate improved functional mobility in home.      Baseline  min to mod A for 20' with BPFRW    Time  6    Period  Weeks    Status  New  Additional Short Term Goals   Additional Short Term Goals  Yes      PT SHORT TERM GOAL #6   Title  Pt will tolerate standing (at counter) without use of power chair with BUE support x 5 mins in order to increase independence with ADLs.     Baseline  works on standing tolerance from power w/c in [place] position.     Time  6    Period  Weeks    Status  New         PT Long Term Goals - 04/19/18 1919      PT LONG TERM GOAL #1   Title  Pt will perform stand pivot transfer w/ LRAD at S level in order to increase independence at home. (Target Date: 07/18/18)    Baseline  min to mod A    Time  12    Period  Weeks    Status  New    Target Date  07/18/18      PT LONG TERM GOAL #2   Title  Pt will ambulate x 68' w/ LRAD at min/guard level in order to indicate improved functional independence in home.     Baseline  min to mod A for 20'     Time  12    Period  Weeks    Status  New      PT LONG TERM GOAL #3   Title  Pt will tolerate 10 mins of standing with BUE support (without use of stander) in order to improve independence with ADLs.     Baseline  Only standing with use of power chair/stander at home    Time  12    Period  Weeks    Status  New      PT LONG TERM GOAL #4   Title  Pt/mother will be independent with final HEP in order to indicate improved functional mobility and decreased fall risk.      Baseline  dependent     Time  12    Period  Weeks    Status  New            Plan - 05/29/18 1258    Clinical Impression Statement  Treatment session continued to focus on tone management with LE stretches, trunk dissociation and timing/sequencing of muscle activation during supine exercises.  In sitting contiued to focus on postural control and weight shifting during <> stand and stand pivots to increase pt independence and decrease mother's level of assistance.  Will continue to address in order to progress towards LTG.    Rehab Potential  Good    Clinical Impairments Affecting Rehab Potential  severity of deficits    PT Frequency  2x / week    PT Duration  12 weeks    PT Treatment/Interventions  ADLs/Self Care Home Management;Aquatic Therapy;DME Instruction;Gait training;Stair training;Functional mobility training;Therapeutic activities;Therapeutic exercise;Balance training;Neuromuscular re-education;Patient/family  education;Orthotic Fit/Training;Passive range of motion    PT Next Visit Plan  Tall kneeling if you have +2?  continue to discuss possible botox to adductors, transitional movements, gait with R PFRW, assess for leg length discrepancy    Consulted and Agree with Plan of Care  Patient;Family member/caregiver    Family Member Consulted  mother       Patient will benefit from skilled therapeutic intervention in order to improve the following deficits and impairments:  Abnormal gait, Decreased balance, Decreased coordination, Decreased endurance, Decreased knowledge of use of DME, Decreased mobility, Decreased range of  motion, Decreased strength, Impaired perceived functional ability, Impaired flexibility, Impaired tone, Impaired UE functional use, Postural dysfunction  Visit Diagnosis: Spastic hemiplegia affecting left dominant side, unspecified etiology (HCC)  Spastic hemiplegia affecting right nondominant side, unspecified etiology ([place])  Unsteadiness on feet  Muscle weakness (generalized)     Problem List Patient Active Problem List   Diagnosis Date Noted  . Reactive airway disease 11/09/2017  . 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 Congenital quadriplegia ([place]) 01/05/2012    Rico Junker, PT, DPT 05/29/18    1:03 PM    [facility] [address] [place], [place], [address] Phone: [phone]   Fax:  [phone]  Name: Taylor Fernandez MRN: [id number] Date of Birth: [birth year]

## 2018-06-01 ENCOUNTER — Ambulatory Visit: Payer: Medicaid Other | Admitting: Rehabilitation

## 2018-06-01 ENCOUNTER — Telehealth: Payer: Self-pay | Admitting: Rehabilitation

## 2018-06-01 ENCOUNTER — Encounter: Payer: Self-pay | Admitting: Rehabilitation

## 2018-06-01 DIAGNOSIS — G8113 Spastic hemiplegia affecting right nondominant side: Secondary | ICD-10-CM

## 2018-06-01 DIAGNOSIS — R2689 Other abnormalities of gait and mobility: Secondary | ICD-10-CM

## 2018-06-01 DIAGNOSIS — G8112 Spastic hemiplegia affecting left dominant side: Secondary | ICD-10-CM | POA: Diagnosis not present

## 2018-06-01 DIAGNOSIS — R2681 Unsteadiness on feet: Secondary | ICD-10-CM

## 2018-06-01 DIAGNOSIS — M6281 Muscle weakness (generalized): Secondary | ICD-10-CM

## 2018-06-01 NOTE — Telephone Encounter (Signed)
Entered in error  Cameron Sprang, PT, MPT [facility] [address] [place], [place], [address] Phone: [phone]   Fax:  [phone] 06/01/18, 11:12 AM

## 2018-06-01 NOTE — Therapy (Signed)
[facility] [address] [place], [place], [address] Phone: [phone]   Fax:  [phone]  Physical Therapy Treatment  Patient Details  Name: Taylor Fernandez MRN: [id number] Date of Birth: 01-09-1996 Referring Provider: Dineen Kid, MD   Encounter Date: 06/01/2018  PT End of Session - 06/01/18 1104    Visit Number  5    Number of Visits  24    Date for PT Re-Evaluation  07/18/18    Authorization Type  Medicaid (approved 24 visits from 7/24-10/15)    Authorization - Visit Number  4    Authorization - Number of Visits  24    PT Start Time  0848    PT Stop Time  0932    PT Time Calculation (min)  44 min    Activity Tolerance  Patient tolerated treatment well    Behavior During Therapy  [facility] for tasks assessed/performed       Past Medical History:  Diagnosis Date  . Cerebral palsy ([place])   . Developmental delay   . Wheelchair bound    Can not stand     Past Surgical History:  Procedure Laterality Date  . EYE MUSCLE SURGERY     22 yrs old and 15-10 yrs old  . KNEE SURGERY     Tendons extended on both legs  . STRABISMUS SURGERY Bilateral 04/03/2015   Procedure: REPAIR STRABISMUS PEDIATRIC;  Surgeon: Everitt Amber, MD;  Location: [facility];  Service: Ophthalmology;  Laterality: Bilateral;    There were no vitals filed for this visit.  Subjective Assessment - 06/01/18 0850    Subjective  Pt reports no changes.      Patient is accompained by:  Family member    Pertinent History  CP (Goes to [employer] 5 days per week 8-11am)    Limitations  House hold activities;Walking;Standing    How long can you stand comfortably?  Is standing for approx 1 hour per day via w/c that is also stander to do school work.     Patient Stated Goals  "To do more for myself."    Currently in Pain?  No/denies                       [facility] Adult PT Treatment/Exercise - 06/01/18 0849      Transfers    Transfers  Sit to Stand;Stand to Sit    Sit to Stand  4: Min guard;With upper extremity assist   in // bars with BUE support   Sit to Stand Details  Verbal cues for sequencing;Verbal cues for technique    Sit to Stand Details (indicate cue type and reason)  Pt able to perform with improved ease when cued for forward trunk lean and weight shift to "dive towards me (PT)."     Stand to Sit  4: Min guard;With upper extremity assist    Stand to Sit Details (indicate cue type and reason)  Verbal cues for sequencing;Verbal cues for technique;Verbal cues for precautions/safety    Stand to Sit Details  Pt needs cues for improved R lateral weight shift when sitting along with forward trunk lean for controlled descent.        Ambulation/Gait   Ambulation/Gait  Yes    Ambulation/Gait Assistance  4: Min assist;3: Mod assist    Ambulation/Gait Assistance Details  Gait in // bars today with emphasis on improving upright and midline postures with improved B hip protraction and improved  R lateral weight shift.  Pt is able to attain these positions however is unable to maintain during forward advancement, esp when advancing RLE.  In efforts to improve R lateral weight shift added shoe lift to LLE shoe due to noted leg length descrepancy (utilized 54mm only then added 41mm lift).  Feel that this did improve postural alignment during standing, but again with forward advancement note that trunk tone and LE tone tend to pull pt into flexed position.  Also continue to note marked BLE adductor tone, causing narrow BOS and limited ability to take larger step.  Performed forwards and backwards gait with marked increase in forward flexion of trunk and increased need for assist to take larger steps when retrostepping.      Ambulation Distance (Feet)  10 Feet   x 2 reps forwards/backwards   Assistive device  Parallel bars    Gait Pattern  Decreased arm swing - right;Decreased arm swing - left;Decreased stride length;Decreased  dorsiflexion - left;Decreased weight shift to right;Decreased dorsiflexion - right;Lateral hip instability;Trunk flexed;Narrow base of support;Scissoring    Ambulation Surface  Level;Indoor      Self-Care   Self-Care  Other Self-Care Comments    Other Self-Care Comments   Had lengthy discussion with pt and mother regarding medical management of tone.  Feel that his progress will be very limited due to amount of tone during active movement.  Mother reports that when on Baclofen pump, tone was either the same, or if increased amount of Baclofen, tone would decrease too much leaving marked weakness and pt requiring much more assist.  Discussed Botox injections since he has not received them since being approx 69 years old.   Pts mother reports "his father will not let him do that" because he had to get them so often and they had to sedate him.  Discussed that they would likely not sedate him now due to age.  Pts mother to discuss with pts father and let PT know plan.               PT Education - 06/01/18 1103    Education Details  see self care    Person(s) Educated  Patient;Parent(s)    Methods  Explanation    Comprehension  Verbalized understanding       PT Short Term Goals - 06/01/18 1109      PT SHORT TERM GOAL #1   Title  Pt/mother will demonstrate understanding of HEP in order to indicate improved functional mobility and improved flexibility.  (Target Date: 06/22/18 date updated to reflect 3 week delay in start)    Baseline  dependent     Time  6    Period  Weeks    Status  New    Target Date  --      PT SHORT TERM GOAL #2   Title  Pt will perform sit<>stand at S level in order to increase independence with ADLs.     Baseline  min to mod A     Time  6    Period  Weeks    Status  New      PT SHORT TERM GOAL #3   Title  Pt will perform stand pivot transfers at min/guard level in order to indicate improved functional mobility.      Baseline  min to mod A    Time  6     Period  Weeks    Status  New  PT SHORT TERM GOAL #4   Title  Pt will be referred to PM&R for tone management as able to improve functional mobility.      Baseline  currently only has PCP    Time  6    Period  Weeks    Status  New      PT SHORT TERM GOAL #5   Title  Pt will ambulate x 30' w/ LRAD at min A level in order to indicate improved functional mobility in home.      Baseline  min to mod A for 20' with BPFRW    Time  6    Period  Weeks    Status  New      PT SHORT TERM GOAL #6   Title  Pt will tolerate standing (at counter) without use of power chair with BUE support x 5 mins in order to increase independence with ADLs.     Baseline  works on standing tolerance from power w/c in [place] position.     Time  6    Period  Weeks    Status  New        PT Long Term Goals - 04/19/18 1919      PT LONG TERM GOAL #1   Title  Pt will perform stand pivot transfer w/ LRAD at S level in order to increase independence at home. (Target Date: 07/18/18)    Baseline  min to mod A    Time  12    Period  Weeks    Status  New    Target Date  07/18/18      PT LONG TERM GOAL #2   Title  Pt will ambulate x 45' w/ LRAD at min/guard level in order to indicate improved functional independence in home.     Baseline  min to mod A for 20'     Time  12    Period  Weeks    Status  New      PT LONG TERM GOAL #3   Title  Pt will tolerate 10 mins of standing with BUE support (without use of stander) in order to improve independence with ADLs.     Baseline  Only standing with use of power chair/stander at home    Time  12    Period  Weeks    Status  New      PT LONG TERM GOAL #4   Title  Pt/mother will be independent with final HEP in order to indicate improved functional mobility and decreased fall risk.      Baseline  dependent     Time  12    Period  Weeks    Status  New            Plan - 06/01/18 1104    Clinical Impression Statement  Skilled session focused on improving  standing postures including improved R lateral weight shift at hips, L lateral trunk flexion, and B forward hip protraction to carryover to improved gait.  Assessed with gait in // bars during session with addition of L shoe lift (9 and 7 mm lifts) to make up for leg length descrepancy with improvement noted.  Will continue to assess and let them know to make appt at [facility] for shoe lift.      Rehab Potential  Good    Clinical Impairments Affecting Rehab Potential  severity of deficits    PT Frequency  2x / week  PT Duration  12 weeks    PT Treatment/Interventions  ADLs/Self Care Home Management;Aquatic Therapy;DME Instruction;Gait training;Stair training;Functional mobility training;Therapeutic activities;Therapeutic exercise;Balance training;Neuromuscular re-education;Patient/family education;Orthotic Fit/Training;Passive range of motion    PT Next Visit Plan  aquatic therapy??, Tall kneeling if you have +2?  continue to discuss possible botox to adductors, transitional movements, gait with R PFRW with 9+7 mm shoe lift, assess for leg length discrepancy, stretching over wedge on left side    Consulted and Agree with Plan of Care  Patient;Family member/caregiver    Family Member Consulted  mother       Patient will benefit from skilled therapeutic intervention in order to improve the following deficits and impairments:  Abnormal gait, Decreased balance, Decreased coordination, Decreased endurance, Decreased knowledge of use of DME, Decreased mobility, Decreased range of motion, Decreased strength, Impaired perceived functional ability, Impaired flexibility, Impaired tone, Impaired UE functional use, Postural dysfunction  Visit Diagnosis: Spastic hemiplegia affecting left dominant side, unspecified etiology (HCC)  Spastic hemiplegia affecting right nondominant side, unspecified etiology ([place])  Unsteadiness on feet  Muscle weakness (generalized)  Other abnormalities of gait and  mobility     Problem List Patient Active Problem List   Diagnosis Date Noted  . Reactive airway disease 11/09/2017  . 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 Congenital quadriplegia ([place]) 01/05/2012    Cameron Sprang, PT, MPT [facility] [address] [place], [place], [address] Phone: [phone]   Fax:  [phone] 06/01/18, 11:19 AM  Name: Taylor Fernandez MRN: [id number] Date of Birth: September 10, 1996

## 2018-06-05 ENCOUNTER — Encounter: Payer: Self-pay | Admitting: Rehabilitation

## 2018-06-05 ENCOUNTER — Ambulatory Visit: Payer: Medicaid Other | Admitting: Rehabilitation

## 2018-06-05 DIAGNOSIS — M6281 Muscle weakness (generalized): Secondary | ICD-10-CM

## 2018-06-05 DIAGNOSIS — G8112 Spastic hemiplegia affecting left dominant side: Secondary | ICD-10-CM | POA: Diagnosis not present

## 2018-06-05 DIAGNOSIS — R2689 Other abnormalities of gait and mobility: Secondary | ICD-10-CM

## 2018-06-05 DIAGNOSIS — G8113 Spastic hemiplegia affecting right nondominant side: Secondary | ICD-10-CM

## 2018-06-05 DIAGNOSIS — R2681 Unsteadiness on feet: Secondary | ICD-10-CM

## 2018-06-05 NOTE — Therapy (Signed)
[facility] [address] [place], [place], [address] Phone: [phone]   Fax:  [phone]  Physical Therapy Treatment  Patient Details  Name: Taylor Fernandez MRN: [id number] Date of Birth: 09/10/1996 Referring Provider: Dineen Kid, MD   Encounter Date: 06/05/2018  PT End of Session - 06/05/18 1300    Visit Number  6    Number of Visits  24    Date for PT Re-Evaluation  07/18/18    Authorization Type  Medicaid (approved 24 visits from 7/24-10/15)    Authorization - Visit Number  5    Authorization - Number of Visits  24    PT Start Time  0840    PT Stop Time  0935    PT Time Calculation (min)  55 min    Activity Tolerance  Patient tolerated treatment well    Behavior During Therapy  [facility] for tasks assessed/performed       Past Medical History:  Diagnosis Date  . Cerebral palsy ([place])   . Developmental delay   . Wheelchair bound    Can not stand     Past Surgical History:  Procedure Laterality Date  . EYE MUSCLE SURGERY     22 yrs old and 25-10 yrs old  . KNEE SURGERY     Tendons extended on both legs  . STRABISMUS SURGERY Bilateral 04/03/2015   Procedure: REPAIR STRABISMUS PEDIATRIC;  Surgeon: Everitt Amber, MD;  Location: [facility];  Service: Ophthalmology;  Laterality: Bilateral;    There were no vitals filed for this visit.  Subjective Assessment - 06/05/18 1249    Subjective  Pt reports no changes since last visit.     Patient is accompained by:  Family member    Pertinent History  CP (Goes to [employer] 5 days per week 8-11am)    Limitations  House hold activities;Walking;Standing    How long can you stand comfortably?  Is standing for approx 1 hour per day via w/c that is also stander to do school work.     Patient Stated Goals  "To do more for myself."    Currently in Pain?  No/denies                       [facility] Adult PT Treatment/Exercise - 06/05/18 0850      Transfers   Transfers  Sit to Stand;Stand to Sit    Sit to Stand  4: Min guard;From elevated surface;With upper extremity assist    Sit to Stand Details  Manual facilitation for weight shifting;Verbal cues for sequencing;Verbal cues for technique    Sit to Stand Details (indicate cue type and reason)  Very light tactile and verbal cues for forward weight shift and trunk lean.     Stand to Sit  4: Min guard;With upper extremity assist    Stand to Sit Details (indicate cue type and reason)  Verbal cues for sequencing;Verbal cues for technique;Verbal cues for precautions/safety    Stand to Sit Details  Min cues for hand placement and safety.     Stand Pivot Transfers  3: Mod assist    Stand Pivot Transfer Details (indicate cue type and reason)  Pt needs heavy facilitation for R lateral weight shift when stepping to seated surface.       Ambulation/Gait   Ambulation/Gait  Yes    Ambulation/Gait Assistance  3: Mod assist    Ambulation/Gait Assistance Details  Had pt ambulate with R  PFRW during session at mod A level.  Placed 9+7 mm shoe lift on pt during gait to decrease leg length descrepancy.  Provided heavy facilitation for forward lateral weight shift , esp once LLE is advanced as he tends to take a very quick step with RLE.  Also note heavy facilitation for upright posture (decreased R trunk lean and increased R lateral weight shift) as he tends to rely on RUE with gait.  Assist for RLE placement to prevent adduction.      Ambulation Distance (Feet)  20 Feet    Assistive device  Right platform walker    Gait Pattern  Decreased arm swing - right;Decreased arm swing - left;Decreased stride length;Decreased dorsiflexion - left;Decreased weight shift to right;Decreased dorsiflexion - right;Lateral hip instability;Trunk flexed;Narrow base of support;Scissoring    Ambulation Surface  Level;Indoor      Self-Care   Self-Care  Other Self-Care Comments    Other Self-Care Comments   Had discussion with pt  and mother at beginning of session regarding PT asking for referral to PM&R.  PT has not recieved, but hopes to give to them as soon as able with regards to tone management.  Pt/mother verbalized understanding.  Also discussed getting pt into aquatic PT sessions and purpose of this for decreasing tone and improving function outside of water and also to set pt up with HEP for pool that mother can do with him.  Aquatic PT had session open up tomorrow, and mother was willing to schedule this, therefore PT will cancel Friday's session.  Provided information handout for pool instructions.       Neuro Re-ed    Neuro Re-ed Details   NMR to decrease tone and more pelvis/trunk dissociation prior to gait.  Performed R lateral flex over wedge with increased assist from PT to remain in forward weight shift to prevent compensation.  Performed x 2 reps of 30 secs.  Progressed to sitting on green physioball working on lateral weight shifts esp to the R with heavy verbal and tactile cues to achieve.  He is able to initiate somewhat during task, however unable to fully achieve R lateral weight shift nor maintain.  Worked on forward weight shift with emphasis on increasing anterior pelvic tilt with sit<>stands from physioball x 5 reps with BUE support from PT.  Performed standing weight shifts x 5 reps again with heavy facilitation for R lateral weight shift at pelvis.  He is able to achieve this position, and can initiate getting back into this position, however is unable to fully maintain for more than a few seconds, despite shoe lift.               PT Education - 06/05/18 1300    Education Details  aquatic therapy    Person(s) Educated  Patient    Methods  Explanation    Comprehension  Verbalized understanding       PT Short Term Goals - 06/01/18 1109      PT SHORT TERM GOAL #1   Title  Pt/mother will demonstrate understanding of HEP in order to indicate improved functional mobility and improved  flexibility.  (Target Date: 06/22/18 date updated to reflect 3 week delay in start)    Baseline  dependent     Time  6    Period  Weeks    Status  New    Target Date  --      PT SHORT TERM GOAL #2   Title  Pt will perform  sit<>stand at S level in order to increase independence with ADLs.     Baseline  min to mod A     Time  6    Period  Weeks    Status  New      PT SHORT TERM GOAL #3   Title  Pt will perform stand pivot transfers at min/guard level in order to indicate improved functional mobility.      Baseline  min to mod A    Time  6    Period  Weeks    Status  New      PT SHORT TERM GOAL #4   Title  Pt will be referred to PM&R for tone management as able to improve functional mobility.      Baseline  currently only has PCP    Time  6    Period  Weeks    Status  New      PT SHORT TERM GOAL #5   Title  Pt will ambulate x 30' w/ LRAD at min A level in order to indicate improved functional mobility in home.      Baseline  min to mod A for 20' with BPFRW    Time  6    Period  Weeks    Status  New      PT SHORT TERM GOAL #6   Title  Pt will tolerate standing (at counter) without use of power chair with BUE support x 5 mins in order to increase independence with ADLs.     Baseline  works on standing tolerance from power w/c in [place] position.     Time  6    Period  Weeks    Status  New        PT Long Term Goals - 04/19/18 1919      PT LONG TERM GOAL #1   Title  Pt will perform stand pivot transfer w/ LRAD at S level in order to increase independence at home. (Target Date: 07/18/18)    Baseline  min to mod A    Time  12    Period  Weeks    Status  New    Target Date  07/18/18      PT LONG TERM GOAL #2   Title  Pt will ambulate x 61' w/ LRAD at min/guard level in order to indicate improved functional independence in home.     Baseline  min to mod A for 20'     Time  12    Period  Weeks    Status  New      PT LONG TERM GOAL #3   Title  Pt will tolerate 10 mins  of standing with BUE support (without use of stander) in order to improve independence with ADLs.     Baseline  Only standing with use of power chair/stander at home    Time  12    Period  Weeks    Status  New      PT LONG TERM GOAL #4   Title  Pt/mother will be independent with final HEP in order to indicate improved functional mobility and decreased fall risk.      Baseline  dependent     Time  12    Period  Weeks    Status  New            Plan - 06/05/18 1301    Clinical Impression Statement  Skilled session focused on discussion regarding  referral and rationale for going to PM&R for tone management, getting pt into aquatic PT tomorrow and how to be prepared for session.  Also continue to address truncal tone during session with stretching along with trunk/pelvis dissociation and gait with R PFRW.  Feel that RW places pt in more flexed position, therefore would like to consider posterior walker.      Rehab Potential  Good    Clinical Impairments Affecting Rehab Potential  severity of deficits    PT Frequency  2x / week    PT Duration  12 weeks    PT Treatment/Interventions  ADLs/Self Care Home Management;Aquatic Therapy;DME Instruction;Gait training;Stair training;Functional mobility training;Therapeutic activities;Therapeutic exercise;Balance training;Neuromuscular re-education;Patient/family education;Orthotic Fit/Training;Passive range of motion    PT Next Visit Plan  start to look at STGs (I udated date because I plan to ask for date extension), Tall kneeling if you have +2?  sitting on physioball, continue to discuss possible botox to adductors, transitional movements, gait with R PFRW with 9+7 mm shoe lift, assess for leg length discrepancy, stretching over wedge on left side    Consulted and Agree with Plan of Care  Patient;Family member/caregiver    Family Member Consulted  mother       Patient will benefit from skilled therapeutic intervention in order to improve the  following deficits and impairments:  Abnormal gait, Decreased balance, Decreased coordination, Decreased endurance, Decreased knowledge of use of DME, Decreased mobility, Decreased range of motion, Decreased strength, Impaired perceived functional ability, Impaired flexibility, Impaired tone, Impaired UE functional use, Postural dysfunction  Visit Diagnosis: Spastic hemiplegia affecting left dominant side, unspecified etiology (HCC)  Spastic hemiplegia affecting right nondominant side, unspecified etiology ([place])  Unsteadiness on feet  Muscle weakness (generalized)  Other abnormalities of gait and mobility     Problem List Patient Active Problem List   Diagnosis Date Noted  . Reactive airway disease 11/09/2017  . 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 Congenital quadriplegia ([place]) 01/05/2012    Cameron Sprang, PT, MPT [facility] [address] [place], [place], [address] Phone: [phone]   Fax:  [phone] 06/05/18, 1:05 PM  Name: MARE LUDTKE MRN: [id number] Date of Birth: 1996/01/29

## 2018-06-06 ENCOUNTER — Encounter: Payer: Self-pay | Admitting: Physical Therapy

## 2018-06-06 ENCOUNTER — Ambulatory Visit: Payer: Medicaid Other | Admitting: Physical Therapy

## 2018-06-06 DIAGNOSIS — R2689 Other abnormalities of gait and mobility: Secondary | ICD-10-CM

## 2018-06-06 DIAGNOSIS — G8112 Spastic hemiplegia affecting left dominant side: Secondary | ICD-10-CM | POA: Diagnosis not present

## 2018-06-06 DIAGNOSIS — R2681 Unsteadiness on feet: Secondary | ICD-10-CM

## 2018-06-06 NOTE — Therapy (Signed)
[facility] [address] [place], [place], [address] Phone: [phone]   Fax:  [phone]  Physical Therapy Treatment  Patient Details  Name: Taylor Fernandez MRN: [id number] Date of Birth: October 11, 1995 Referring Provider: Dineen Kid, MD   Encounter Date: 06/06/2018  PT End of Session - 06/06/18 2011    Visit Number  7    Number of Visits  24    Date for PT Re-Evaluation  07/18/18    Authorization Type  Medicaid (approved 24 visits from 7/24-10/15)    Authorization - Visit Number  6    Authorization - Number of Visits  24    PT Start Time  1300    PT Stop Time  1345    PT Time Calculation (min)  45 min    Equipment Utilized During Treatment  Other (comment)   flotation blt & aquatic walker   Activity Tolerance  Patient tolerated treatment well    Behavior During Therapy  [facility] for tasks assessed/performed       Past Medical History:  Diagnosis Date  . Cerebral palsy ([place])   . Developmental delay   . Wheelchair bound    Can not stand     Past Surgical History:  Procedure Laterality Date  . EYE MUSCLE SURGERY     22 yrs old and 78-10 yrs old  . KNEE SURGERY     Tendons extended on both legs  . STRABISMUS SURGERY Bilateral 04/03/2015   Procedure: REPAIR STRABISMUS PEDIATRIC;  Surgeon: Everitt Amber, MD;  Location: [facility];  Service: Ophthalmology;  Laterality: Bilateral;    There were no vitals filed for this visit.  Subjective Assessment - 06/06/18 1739    Subjective  Pt answers "yes" when asked if he is ready to get into pool; mother reports it has "been years" since he has been in the pool     Patient is accompained by:  Family member   mother   Pertinent History  CP (Goes to [employer] 5 days per week 8-11am)    Limitations  House hold activities;Walking;Standing    How long can you stand comfortably?  Is standing for approx 1 hour per day via w/c that is also stander to do school work.      Patient Stated Goals  "To do more for myself."    Currently in Pain?  No/denies        Pool temp 87 degrees; Pt was transferred from his wheelchair to pool wheelchair by mother -using stand pivot transfer/total lift into wheelchair by mother  Pt able to stand from pool wheelchair and hold onto pool rail on side of pool ramp Flotation belt used on pt entire treatment session   Pt was transferred back into pool wheelchair for exiting pool        Adult Aquatic Therapy - 06/06/18 2005      Aquatic Therapy Subjective   Subjective  Pt was transferred from his wheelchair into pool wheelchair to enter pool via ramp       Treatment   Gait  Pt gait trained in pool in approx. 4' depth of water with use of aquatic walker (comprised of PVC type material) ; pt held walker with Rt hand on front piece of walker and Lt hand on left side of RW:  pt amb. approx. 30' across pool x 4 laps with mod. tactile cues/faciliation on Rt rib cage to facilitate elongation and mod tactile cues on left pelvis for  adduction.      Exercises  Pt performed standing hip abduction 10 reps each leg with LUE support on pool wall;  hip flexion in standing with RUE support on pool wall with min assist for recovery of LOB    Specific Exercises  Hip/Low Back    Hip/Low Back  Pt wearing flotation belt - reclined back with use of pool noodle under arms - supported by PT with assistance under pelvis for flotation; pt performed hip abduction/adduction 10 reps; hip flexion/extension (bicycling LE's) in reclined position 10 reps    Balance   Pt stood with use of aquatic walker with RUE support, progressing to no UE support (pt wearing flotation belt entire treatment session)       Pt stood in pool with flotation belt - inside of aquatic walker - performed static standing with lifting LUE up as high as possible  with CGA to min assist for balance maintenance using buoyancy of water for support  Pt performed strengthening  exercises listed above for bil. Hip strengthening using viscosity of water for resistance as well as for support of LE's and trunk   Pt performed LE hip flexion/extension in reclined position with cues to move arms for strengthening; moderate support given for relaxation of truncal tone Viscosity of water used for resistance and support of body and LE's  Pt performed heel raises 10 reps - with UE support on pool wall; viscosity of water used for resistance with this exercise   Pt performed sideways amb. With bil. UE support on pool wall - approx. 25' x 2 reps with cues for increased step length using current of water for resistance  Pt gait trained at end of session without aquatic walker - flotation belt remained on pt - with RUE support for 1st rep (30' across pool); pt leaned forward due to lack Of support; changed support on 2nd rep and provided bil. UE support on PT's forearms in front of pt - this placement worked much better with use of buoyancy of  Water providing support.      PT Short Term Goals - 06/01/18 1109      PT SHORT TERM GOAL #1   Title  Pt/mother will demonstrate understanding of HEP in order to indicate improved functional mobility and improved flexibility.  (Target Date: 06/22/18 date updated to reflect 3 week delay in start)    Baseline  dependent     Time  6    Period  Weeks    Status  New    Target Date  --      PT SHORT TERM GOAL #2   Title  Pt will perform sit<>stand at S level in order to increase independence with ADLs.     Baseline  min to mod A     Time  6    Period  Weeks    Status  New      PT SHORT TERM GOAL #3   Title  Pt will perform stand pivot transfers at min/guard level in order to indicate improved functional mobility.      Baseline  min to mod A    Time  6    Period  Weeks    Status  New      PT SHORT TERM GOAL #4   Title  Pt will be referred to PM&R for tone management as able to improve functional mobility.      Baseline  currently  only has PCP    Time  6    Period  Weeks    Status  New      PT SHORT TERM GOAL #5   Title  Pt will ambulate x 30' w/ LRAD at min A level in order to indicate improved functional mobility in home.      Baseline  min to mod A for 20' with BPFRW    Time  6    Period  Weeks    Status  New      PT SHORT TERM GOAL #6   Title  Pt will tolerate standing (at counter) without use of power chair with BUE support x 5 mins in order to increase independence with ADLs.     Baseline  works on standing tolerance from power w/c in [place] position.     Time  6    Period  Weeks    Status  New        PT Long Term Goals - 04/19/18 1919      PT LONG TERM GOAL #1   Title  Pt will perform stand pivot transfer w/ LRAD at S level in order to increase independence at home. (Target Date: 07/18/18)    Baseline  min to mod A    Time  12    Period  Weeks    Status  New    Target Date  07/18/18      PT LONG TERM GOAL #2   Title  Pt will ambulate x 4' w/ LRAD at min/guard level in order to indicate improved functional independence in home.     Baseline  min to mod A for 20'     Time  12    Period  Weeks    Status  New      PT LONG TERM GOAL #3   Title  Pt will tolerate 10 mins of standing with BUE support (without use of stander) in order to improve independence with ADLs.     Baseline  Only standing with use of power chair/stander at home    Time  12    Period  Weeks    Status  New      PT LONG TERM GOAL #4   Title  Pt/mother will be independent with final HEP in order to indicate improved functional mobility and decreased fall risk.      Baseline  dependent     Time  12    Period  Weeks    Status  New            Plan - 06/06/18 2012    Clinical Impression Statement  Aquatic therapy session focused on gait training in pool with use of buoyancy for support with moderate to heavy tactile cues for faciliation of Rt trunk elongation and Lt pelvis adduction.  Pt had difficulty performing hip  abduction/adduction in both standing and supine positions due to increased tone/spasticity in LE's.  Pt able to relax with moderate support for flotation with use of flotation belt and assistance under hips for increased buoyancy.  Cues needed for pt to relax Rt hand/fingers.  Pt tolerated aquatic therapy session well, reporting fatigue at end of session.    Rehab Potential  Good    Clinical Impairments Affecting Rehab Potential  severity of deficits    PT Frequency  2x / week    PT Duration  12 weeks    PT Treatment/Interventions  ADLs/Self Care Home Management;Aquatic Therapy;DME Instruction;Gait training;Stair training;Functional mobility training;Therapeutic activities;Therapeutic exercise;Balance training;Neuromuscular re-education;Patient/family  education;Orthotic Fit/Training;Passive range of motion    PT Next Visit Plan  start to look at STGs (I udated date because I plan to ask for date extension), Tall kneeling if you have +2?  sitting on physioball, continue to discuss possible botox to adductors, transitional movements, gait with R PFRW with 9+7 mm shoe lift, assess for leg length discrepancy, stretching over wedge on left side    Consulted and Agree with Plan of Care  Patient    Family Member Consulted  mother       Patient will benefit from skilled therapeutic intervention in order to improve the following deficits and impairments:  Abnormal gait, Decreased balance, Decreased coordination, Decreased endurance, Decreased knowledge of use of DME, Decreased mobility, Decreased range of motion, Decreased strength, Impaired perceived functional ability, Impaired flexibility, Impaired tone, Impaired UE functional use, Postural dysfunction  Visit Diagnosis: Other abnormalities of gait and mobility  Unsteadiness on feet     Problem List Patient Active Problem List   Diagnosis Date Noted  . Reactive airway disease 11/09/2017  . 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  Congenital quadriplegia ([place]) 01/05/2012    Lachlan Mckim, [place], PT 06/06/2018, 8:21 PM  [facility] [address] [place], [place], [address] Phone: [phone]   Fax:  [phone]  Name: Taylor Fernandez MRN: [id number] Date of Birth: 09/25/1996

## 2018-06-08 ENCOUNTER — Ambulatory Visit: Payer: Medicaid Other | Admitting: Rehabilitation

## 2018-06-12 ENCOUNTER — Encounter: Payer: Self-pay | Admitting: Rehabilitation

## 2018-06-12 ENCOUNTER — Ambulatory Visit: Payer: Medicaid Other | Attending: Family Medicine | Admitting: Rehabilitation

## 2018-06-12 DIAGNOSIS — R2689 Other abnormalities of gait and mobility: Secondary | ICD-10-CM | POA: Diagnosis present

## 2018-06-12 DIAGNOSIS — G8113 Spastic hemiplegia affecting right nondominant side: Secondary | ICD-10-CM

## 2018-06-12 DIAGNOSIS — R2681 Unsteadiness on feet: Secondary | ICD-10-CM | POA: Insufficient documentation

## 2018-06-12 DIAGNOSIS — M6281 Muscle weakness (generalized): Secondary | ICD-10-CM | POA: Diagnosis present

## 2018-06-12 DIAGNOSIS — G8112 Spastic hemiplegia affecting left dominant side: Secondary | ICD-10-CM | POA: Insufficient documentation

## 2018-06-12 NOTE — Therapy (Signed)
[facility] [address] [place], [place], [address] Phone: [phone]   Fax:  [phone]  Physical Therapy Treatment  Patient Details  Name: Taylor Fernandez MRN: [id number] Date of Birth: Dec 27, 1995 Referring Provider: Dineen Kid, MD   Encounter Date: 06/12/2018  PT End of Session - 06/12/18 1508    Visit Number  8    Number of Visits  24    Date for PT Re-Evaluation  07/18/18    Authorization Type  Medicaid (approved 24 visits from 7/24-10/15)    Authorization - Visit Number  7    Authorization - Number of Visits  24    PT Start Time  0932    PT Stop Time  1015    PT Time Calculation (min)  43 min    Equipment Utilized During Treatment  Other (comment)   flotation blt & aquatic walker   Activity Tolerance  Patient tolerated treatment well    Behavior During Therapy  [facility] for tasks assessed/performed       Past Medical History:  Diagnosis Date  . Cerebral palsy ([place])   . Developmental delay   . Wheelchair bound    Can not stand     Past Surgical History:  Procedure Laterality Date  . EYE MUSCLE SURGERY     22 yrs old and 20-10 yrs old  . KNEE SURGERY     Tendons extended on both legs  . STRABISMUS SURGERY Bilateral 04/03/2015   Procedure: REPAIR STRABISMUS PEDIATRIC;  Surgeon: Everitt Amber, MD;  Location: [facility];  Service: Ophthalmology;  Laterality: Bilateral;    There were no vitals filed for this visit.  Subjective Assessment - 06/12/18 0936    Subjective  Pt reports that pool therapy went well.  Was not too tired.      Pertinent History  CP (Goes to [employer] 5 days per week 8-11am)    Limitations  House hold activities;Walking;Standing    How long can you stand comfortably?  Is standing for approx 1 hour per day via w/c that is also stander to do school work.     Patient Stated Goals  "To do more for myself."    Currently in Pain?  No/denies                        [facility] Adult PT Treatment/Exercise - 06/12/18 0945      Transfers   Transfers  Sit to Stand;Stand to Sit;Stand Pivot Transfers    Sit to Stand  4: Min guard;4: Min assist    Sit to Stand Details  Manual facilitation for weight shifting;Verbal cues for sequencing;Verbal cues for technique    Sit to Stand Details (indicate cue type and reason)  B HHA    Stand to Sit  4: Min guard    Stand to Sit Details (indicate cue type and reason)  Verbal cues for sequencing;Verbal cues for technique;Verbal cues for precautions/safety    Stand to Sit Details  B HHA with cues for increasing trunk flexion for more controlled descent and decreased extensor tone in LEs.     Stand Pivot Transfers  4: Min assist    Stand Pivot Transfer Details (indicate cue type and reason)  Pt able to perform stand pivot transfer to the R today with marked improvement in weight shift to the R.       Ambulation/Gait   Ambulation/Gait  Yes    Ambulation/Gait Assistance  1: +  2 Total assist    Ambulation/Gait Assistance Details  Note that 93mm (9+26mm) shoe lift donned throughout gait.  PT student assisting pt anteriorly supporting pts L arm on her shoulder and under his R elbow due to flexion contracture.  PT behind pt to provide increased assist for L lateral and forward weight shift prior to advancing RLE.  PT student providing max A to provide upright support through UEs and trunk and PT providing assist at pelvis/LEs for appopriate weight shift and to block pt from advancing RLE too soon.  Pt becomes very fatigue, increasing pressure and tone thorughout UEs/trunk.      Ambulation Distance (Feet)  45 Feet    Assistive device  Other (Comment)   two person assist ant/post   Gait Pattern  Decreased arm swing - right;Decreased arm swing - left;Decreased stride length;Decreased dorsiflexion - left;Decreased weight shift to right;Decreased dorsiflexion - right;Lateral hip instability;Trunk flexed;Narrow  base of support;Scissoring    Ambulation Surface  Level;Indoor      Self-Care   Self-Care  Other Self-Care Comments    Other Self-Care Comments   Briefly discussed pt getting appt with [facility] where he had AFOs made in order to get shoe lift.  Pts mother verbalized understanding.  Also disucssed getting pt back       Neuro Re-ed    Neuro Re-ed Details   NMR in tall kneeling with Ulis Rias bench to continue to address postural control and R lateral weight shift at pelvis.  Pt tolerated position well however would recommend 2 person to achieve position and perform more high level tasks in position.  While in tall kneeling had pt work on R lateral weight shifts with PT/PT student providing facilitation several reps followed by having pt initiate movement.  Pt better able to initiate forward hip extension, however still has marked difficulty with complete R lateral weight shift (can get to midline).  Intermittently alternating UE support from [place] bench to PT students shoulders, reaching to the R with RUE (as much as able) for target.  PT providing counter force at R axilla for L lateral flex and at pelvis for R lateral weight shift.  PT able to get pt to midline, however he is unable to maintain more than 5-7 secs.               PT Education - 06/12/18 1506    Education Details  going to [facility] to see about shoe lift    Person(s) Educated  Patient    Methods  Explanation    Comprehension  Verbalized understanding       PT Short Term Goals - 06/01/18 1109      PT SHORT TERM GOAL #1   Title  Pt/mother will demonstrate understanding of HEP in order to indicate improved functional mobility and improved flexibility.  (Target Date: 06/22/18 date updated to reflect 3 week delay in start)    Baseline  dependent     Time  6    Period  Weeks    Status  New    Target Date  --      PT SHORT TERM GOAL #2   Title  Pt will perform sit<>stand at S level in order to increase independence with ADLs.      Baseline  min to mod A     Time  6    Period  Weeks    Status  New      PT SHORT TERM GOAL #3  Title  Pt will perform stand pivot transfers at min/guard level in order to indicate improved functional mobility.      Baseline  min to mod A    Time  6    Period  Weeks    Status  New      PT SHORT TERM GOAL #4   Title  Pt will be referred to PM&R for tone management as able to improve functional mobility.      Baseline  currently only has PCP    Time  6    Period  Weeks    Status  New      PT SHORT TERM GOAL #5   Title  Pt will ambulate x 30' w/ LRAD at min A level in order to indicate improved functional mobility in home.      Baseline  min to mod A for 20' with BPFRW    Time  6    Period  Weeks    Status  New      PT SHORT TERM GOAL #6   Title  Pt will tolerate standing (at counter) without use of power chair with BUE support x 5 mins in order to increase independence with ADLs.     Baseline  works on standing tolerance from power w/c in [place] position.     Time  6    Period  Weeks    Status  New        PT Long Term Goals - 04/19/18 1919      PT LONG TERM GOAL #1   Title  Pt will perform stand pivot transfer w/ LRAD at S level in order to increase independence at home. (Target Date: 07/18/18)    Baseline  min to mod A    Time  12    Period  Weeks    Status  New    Target Date  07/18/18      PT LONG TERM GOAL #2   Title  Pt will ambulate x 50' w/ LRAD at min/guard level in order to indicate improved functional independence in home.     Baseline  min to mod A for 20'     Time  12    Period  Weeks    Status  New      PT LONG TERM GOAL #3   Title  Pt will tolerate 10 mins of standing with BUE support (without use of stander) in order to improve independence with ADLs.     Baseline  Only standing with use of power chair/stander at home    Time  12    Period  Weeks    Status  New      PT LONG TERM GOAL #4   Title  Pt/mother will be independent with final HEP in  order to indicate improved functional mobility and decreased fall risk.      Baseline  dependent     Time  12    Period  Weeks    Status  New            Plan - 06/12/18 1509    Clinical Impression Statement  Skilled session focused on NMR in tall kneeling to address postural control and improved R lateral weight shift with decreased UE support.  Pt continues to demonstrate marked tone in trunk, but is better able to initiate improved postural control during session and did much better with gait with +2A with shoe lift.  Rehab Potential  Good    Clinical Impairments Affecting Rehab Potential  severity of deficits    PT Frequency  2x / week    PT Duration  12 weeks    PT Treatment/Interventions  ADLs/Self Care Home Management;Aquatic Therapy;DME Instruction;Gait training;Stair training;Functional mobility training;Therapeutic activities;Therapeutic exercise;Balance training;Neuromuscular re-education;Patient/family education;Orthotic Fit/Training;Passive range of motion    PT Next Visit Plan  start to look at STGs (I udated date because I plan to ask for date extension), Tall kneeling if you have +2?  sitting on physioball, continue to discuss possible botox to adductors, transitional movements, gait with R PFRW with 9+7 mm shoe lift, assess for leg length discrepancy, stretching over wedge on left side    Consulted and Agree with Plan of Care  Patient    Family Member Consulted  mother       Patient will benefit from skilled therapeutic intervention in order to improve the following deficits and impairments:  Abnormal gait, Decreased balance, Decreased coordination, Decreased endurance, Decreased knowledge of use of DME, Decreased mobility, Decreased range of motion, Decreased strength, Impaired perceived functional ability, Impaired flexibility, Impaired tone, Impaired UE functional use, Postural dysfunction  Visit Diagnosis: Other abnormalities of gait and mobility  Unsteadiness  on feet  Spastic hemiplegia affecting left dominant side, unspecified etiology (HCC)  Spastic hemiplegia affecting right nondominant side, unspecified etiology (HCC)  Muscle weakness (generalized)     Problem List Patient Active Problem List   Diagnosis Date Noted  . Reactive airway disease 11/09/2017  . 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 Congenital quadriplegia ([place]) 01/05/2012    Cameron Sprang, PT, MPT [facility] [address] [place], [place], [address] Phone: [phone]   Fax:  [phone] 06/12/18, 3:11 PM  Name: Taylor Fernandez MRN: [id number] Date of Birth: Jun 16, 1996

## 2018-06-13 ENCOUNTER — Ambulatory Visit: Payer: Medicaid Other | Admitting: Physical Therapy

## 2018-06-13 ENCOUNTER — Encounter: Payer: Self-pay | Admitting: Physical Therapy

## 2018-06-13 DIAGNOSIS — R2689 Other abnormalities of gait and mobility: Secondary | ICD-10-CM

## 2018-06-13 DIAGNOSIS — M6281 Muscle weakness (generalized): Secondary | ICD-10-CM

## 2018-06-13 DIAGNOSIS — R2681 Unsteadiness on feet: Secondary | ICD-10-CM

## 2018-06-13 NOTE — Therapy (Signed)
[facility] [address] [place], [place], [address] Phone: [phone]   Fax:  [phone]  Physical Therapy Treatment  Patient Details  Name: Taylor Fernandez MRN: [id number] Date of Birth: 02-Jan-1996 Referring Provider: Dineen Kid, MD   Encounter Date: 06/13/2018  PT End of Session - 06/13/18 1819    Visit Number  9    Number of Visits  24    Date for PT Re-Evaluation  07/18/18    Authorization Type  Medicaid (approved 24 visits from 7/24-10/15)    Authorization - Visit Number  8    Authorization - Number of Visits  24    PT Start Time  1300    PT Stop Time  1345    PT Time Calculation (min)  45 min    Equipment Utilized During Treatment  Other (comment)   flotation belt, pool noodle and aquatic walker   Activity Tolerance  Patient tolerated treatment well    Behavior During Therapy  [facility] for tasks assessed/performed       Past Medical History:  Diagnosis Date  . Cerebral palsy ([place])   . Developmental delay   . Wheelchair bound    Can not stand     Past Surgical History:  Procedure Laterality Date  . EYE MUSCLE SURGERY     23 yrs old and 29-10 yrs old  . KNEE SURGERY     Tendons extended on both legs  . STRABISMUS SURGERY Bilateral 04/03/2015   Procedure: REPAIR STRABISMUS PEDIATRIC;  Surgeon: Everitt Amber, MD;  Location: [facility];  Service: Ophthalmology;  Laterality: Bilateral;    There were no vitals filed for this visit.  Subjective Assessment - 06/13/18 1744    Subjective  Pt states he is ready to get in the pool    Patient is accompained by:  Family member   mother   Pertinent History  CP (Goes to [employer] 5 days per week 8-11am)    Limitations  House hold activities;Walking;Standing    Patient Stated Goals  "To do more for myself."    Currently in Pain?  No/denies            Pool temp 87 degrees       Adult Aquatic Therapy - 06/13/18 1815      Aquatic Therapy  Subjective   Subjective  Mother transferred pt from his wheelchair into pool wheelchair; pt entered pool via wheelchair ramp; flotation belt placed on pt       Treatment   Gait  gait trained in pool in 4' water depth with use of water walker - approx. 25' x 6 reps; verbal and tactile cues to decrease speed, weight shift onto stance leg prior to stepping with opposite LE:  tactile cues for Rt trunk elongation and Lt pelvis adduction     Exercises  Pt performed standing hip abduction (as able) 10 reps each leg iwth bil. UE support on pool wall; hip extension 10 reps in standing with bil. UE support on pool wall; hip flexion 10 reps each leg with bil. E support on pool wall;      Specific Exercises  Hip/Low Back    Hip/Low Back  Pt wearing flotation belt - reclined back with use of pool noodle under arms - supported by PT with assistance under pelvis for flotation; pt performed hip abduction/adduction 10 reps; hip flexion/extension (bicycling LE's) in reclined position 10 reps    Balance  Pt stood statically with UE  support on aquatic walker, progressing to no UE support (pt wearing flotation belt for entire tx session)       Pt sat on bench in pool; worked on sit to stand with cues to lean anteriorly and push through legs upon initial standing;  5 reps with mod to min assist  With pt holding onto PT's RUE with his LUE;  Progressed to 5 reps with CGA only without UE support on PT's forearm   Performed bil. Knee AROM - flexion and extension within available range - pt seated on bench in pool; 10 reps each leg with assistance to achieve his full knee ROM, as bil. Knee ROM limited by hamstring contractures  Pt performed sideways amb. 18' across pool with use of aquatic walker;  20' attempted backward ambulation with use of aquatic walker with mod assist, as  Pt has difficulty performing hip extension     Pt exited pool by pool wheelchair - transferred to his wheelchair by his  mother         PT Short Term Goals - 06/01/18 1109      PT SHORT TERM GOAL #1   Title  Pt/mother will demonstrate understanding of HEP in order to indicate improved functional mobility and improved flexibility.  (Target Date: 06/22/18 date updated to reflect 3 week delay in start)    Baseline  dependent     Time  6    Period  Weeks    Status  New    Target Date  --      PT SHORT TERM GOAL #2   Title  Pt will perform sit<>stand at S level in order to increase independence with ADLs.     Baseline  min to mod A     Time  6    Period  Weeks    Status  New      PT SHORT TERM GOAL #3   Title  Pt will perform stand pivot transfers at min/guard level in order to indicate improved functional mobility.      Baseline  min to mod A    Time  6    Period  Weeks    Status  New      PT SHORT TERM GOAL #4   Title  Pt will be referred to PM&R for tone management as able to improve functional mobility.      Baseline  currently only has PCP    Time  6    Period  Weeks    Status  New      PT SHORT TERM GOAL #5   Title  Pt will ambulate x 30' w/ LRAD at min A level in order to indicate improved functional mobility in home.      Baseline  min to mod A for 20' with BPFRW    Time  6    Period  Weeks    Status  New      PT SHORT TERM GOAL #6   Title  Pt will tolerate standing (at counter) without use of power chair with BUE support x 5 mins in order to increase independence with ADLs.     Baseline  works on standing tolerance from power w/c in [place] position.     Time  6    Period  Weeks    Status  New        PT Long Term Goals - 04/19/18 1919      PT LONG TERM GOAL #1  Title  Pt will perform stand pivot transfer w/ LRAD at S level in order to increase independence at home. (Target Date: 07/18/18)    Baseline  min to mod A    Time  12    Period  Weeks    Status  New    Target Date  07/18/18      PT LONG TERM GOAL #2   Title  Pt will ambulate x 31' w/ LRAD at min/guard level  in order to indicate improved functional independence in home.     Baseline  min to mod A for 20'     Time  12    Period  Weeks    Status  New      PT LONG TERM GOAL #3   Title  Pt will tolerate 10 mins of standing with BUE support (without use of stander) in order to improve independence with ADLs.     Baseline  Only standing with use of power chair/stander at home    Time  12    Period  Weeks    Status  New      PT LONG TERM GOAL #4   Title  Pt/mother will be independent with final HEP in order to indicate improved functional mobility and decreased fall risk.      Baseline  dependent     Time  12    Period  Weeks    Status  New            Plan - 06/13/18 1821    Clinical Impression Statement  Pt demonstrating improved postural control and slightly decreased tone as evidenced by pt's ability to maintain more erect posture with Rt trunk elongation with combination of verbal and tactile cues to keep Rt shoulder elevated and trunk elongated; pt able to float without PT's Rt knee under hips for flotation in supine position     Rehab Potential  Good    Clinical Impairments Affecting Rehab Potential  severity of deficits    PT Frequency  2x / week    PT Duration  12 weeks    PT Treatment/Interventions  ADLs/Self Care Home Management;Aquatic Therapy;DME Instruction;Gait training;Stair training;Functional mobility training;Therapeutic activities;Therapeutic exercise;Balance training;Neuromuscular re-education;Patient/family education;Orthotic Fit/Training;Passive range of motion    PT Next Visit Plan  start to look at STGs (I udated date because I plan to ask for date extension), Tall kneeling if you have +2?  sitting on physioball, continue to discuss possible botox to adductors, transitional movements, gait with R PFRW with 9+7 mm shoe lift, assess for leg length discrepancy, stretching over wedge on left side    Consulted and Agree with Plan of Care  Patient    Family Member Consulted   mother       Patient will benefit from skilled therapeutic intervention in order to improve the following deficits and impairments:  Abnormal gait, Decreased balance, Decreased coordination, Decreased endurance, Decreased knowledge of use of DME, Decreased mobility, Decreased range of motion, Decreased strength, Impaired perceived functional ability, Impaired flexibility, Impaired tone, Impaired UE functional use, Postural dysfunction  Visit Diagnosis: Other abnormalities of gait and mobility  Unsteadiness on feet  Muscle weakness (generalized)     Problem List Patient Active Problem List   Diagnosis Date Noted  . Reactive airway disease 11/09/2017  . 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 Congenital quadriplegia ([place]) 01/05/2012    Chase Arnall, [place], PT 06/13/2018, 6:39 PM  [facility] [address]  [place], [place], [address] Phone: [phone]   Fax:  [phone]  Name: MAJD TISSUE MRN: [id number] Date of Birth: 1996/05/17

## 2018-06-15 ENCOUNTER — Ambulatory Visit: Payer: Medicaid Other | Admitting: Rehabilitation

## 2018-06-19 ENCOUNTER — Ambulatory Visit: Payer: Medicaid Other | Admitting: Physical Therapy

## 2018-06-20 ENCOUNTER — Ambulatory Visit: Payer: Medicaid Other | Admitting: Physical Therapy

## 2018-06-20 ENCOUNTER — Encounter: Payer: Self-pay | Admitting: Physical Therapy

## 2018-06-20 DIAGNOSIS — R2689 Other abnormalities of gait and mobility: Secondary | ICD-10-CM | POA: Diagnosis not present

## 2018-06-20 DIAGNOSIS — M6281 Muscle weakness (generalized): Secondary | ICD-10-CM

## 2018-06-20 DIAGNOSIS — R2681 Unsteadiness on feet: Secondary | ICD-10-CM

## 2018-06-20 NOTE — Therapy (Signed)
[facility] [address] [place], [place], [address] Phone: [phone]   Fax:  [phone]  Physical Therapy Treatment  Patient Details  Name: Taylor Fernandez MRN: [id number] Date of Birth: 22-Apr-1996 Referring Provider: Dineen Kid, MD   Encounter Date: 06/20/2018  PT End of Session - 06/20/18 1944    Visit Number  10    Number of Visits  24    Date for PT Re-Evaluation  07/18/18    Authorization Type  Medicaid (approved 24 visits from 7/24-10/15)    Authorization - Visit Number  9    Authorization - Number of Visits  24    PT Start Time  9323    PT Stop Time  1520    PT Time Calculation (min)  45 min    Equipment Utilized During Treatment  Other (comment)   flotation belt   Activity Tolerance  Patient tolerated treatment well    Behavior During Therapy  [facility] for tasks assessed/performed       Past Medical History:  Diagnosis Date  . Cerebral palsy ([place])   . Developmental delay   . Wheelchair bound    Can not stand     Past Surgical History:  Procedure Laterality Date  . EYE MUSCLE SURGERY     22 yrs old and 55-10 yrs old  . KNEE SURGERY     Tendons extended on both legs  . STRABISMUS SURGERY Bilateral 04/03/2015   Procedure: REPAIR STRABISMUS PEDIATRIC;  Surgeon: Everitt Amber, MD;  Location: [facility];  Service: Ophthalmology;  Laterality: Bilateral;    There were no vitals filed for this visit.  Subjective Assessment - 06/20/18 1943    Subjective  Pt reports no problems since previous aquatic therapy session last week - ready to get in pool    Patient is accompained by:  Family member    Patient Stated Goals  "To do more for myself."    Currently in Pain?  No/denies      AQUATIC THERAPY SESSION:  Pool temp. 87.5 degrees;  Floreen Comber, SPT present and assisting during session  Pt was transferred from his wheelchair to pool wheelchair by his mother; entered and exited pool via pool  wheelchair via wheelchair ramp Pt was transferred into pool wheelchair by PT at end of session using a stand pivot transfer - pt needed assistance to move body back in wheelchair  Pt gait trained using water walker with tactile cues/manual facilitation on Rt lateral upper trunk in Rt axilla region for Rt trunk elongation and tactile cue on Lt pelvis for adduction To facilitate upright posture; pt gait trained with bil. UE support on front of water walker across entire length of recreational pool (approx. 100') x 2 reps  Assisted pt in stepping, weight shifting onto stance leg as able prior to advancing other LE; difficulty maintaining complete foot on floor due to buoyancy of water and also  Due to spasticity  Pt performed marching in place with use of water walker for support for weight shifting onto stance leg and for improved dynamic standing balance  Practiced standing statically at side of pool with bil. UE support with pt attempting to stand upright and keep Rt shoulder elevated for more erect posture  Rt lateral trunk stretch with use of pool bar bell - standing, rotating to left side - 10 sec hold x 3 reps for Rt lateral trunk stretching   Pt reclined into supine position with support of  PT - using noodle under arms and use of flotation belt; Floreen Comber, SPT assisted with increasing ROM for hip abduction/adduction in this Supine position - 10 reps: bil. Knee flexion with assistance provided by [facility] for increased AROM, then manual resistance given for bil. LE extension for quad & hip extensor strengthening - 10 reps Cues for pt to really push into Cara's chest for resistance   Gentle rocking in this reclined position for tone reduction and relaxation - attempting to dissociate trunk and pelvis                        PT Short Term Goals - 06/01/18 1109      PT SHORT TERM GOAL #1   Title  Pt/mother will demonstrate understanding of HEP in order to indicate improved  functional mobility and improved flexibility.  (Target Date: 06/22/18 date updated to reflect 3 week delay in start)    Baseline  dependent     Time  6    Period  Weeks    Status  New    Target Date  --      PT SHORT TERM GOAL #2   Title  Pt will perform sit<>stand at S level in order to increase independence with ADLs.     Baseline  min to mod A     Time  6    Period  Weeks    Status  New      PT SHORT TERM GOAL #3   Title  Pt will perform stand pivot transfers at min/guard level in order to indicate improved functional mobility.      Baseline  min to mod A    Time  6    Period  Weeks    Status  New      PT SHORT TERM GOAL #4   Title  Pt will be referred to PM&R for tone management as able to improve functional mobility.      Baseline  currently only has PCP    Time  6    Period  Weeks    Status  New      PT SHORT TERM GOAL #5   Title  Pt will ambulate x 30' w/ LRAD at min A level in order to indicate improved functional mobility in home.      Baseline  min to mod A for 20' with BPFRW    Time  6    Period  Weeks    Status  New      PT SHORT TERM GOAL #6   Title  Pt will tolerate standing (at counter) without use of power chair with BUE support x 5 mins in order to increase independence with ADLs.     Baseline  works on standing tolerance from power w/c in [place] position.     Time  6    Period  Weeks    Status  New        PT Long Term Goals - 04/19/18 1919      PT LONG TERM GOAL #1   Title  Pt will perform stand pivot transfer w/ LRAD at S level in order to increase independence at home. (Target Date: 07/18/18)    Baseline  min to mod A    Time  12    Period  Weeks    Status  New    Target Date  07/18/18      PT LONG TERM GOAL #2  Title  Pt will ambulate x 39' w/ LRAD at min/guard level in order to indicate improved functional independence in home.     Baseline  min to mod A for 20'     Time  12    Period  Weeks    Status  New      PT LONG TERM GOAL #3    Title  Pt will tolerate 10 mins of standing with BUE support (without use of stander) in order to improve independence with ADLs.     Baseline  Only standing with use of power chair/stander at home    Time  12    Period  Weeks    Status  New      PT LONG TERM GOAL #4   Title  Pt/mother will be independent with final HEP in order to indicate improved functional mobility and decreased fall risk.      Baseline  dependent     Time  12    Period  Weeks    Status  New            Plan - 06/20/18 1945    Clinical Impression Statement  Pt demonstrating improved posture in pool with increased Rt upper trunk and Rt shoulder elevation with increased Rt trunk elongation;  pt has difficulty shifting weight anteriorly during gait training in water due to spasticity and buoyancy of water, however, significant improvement noted with decreased speed of gait and with cues to decrease step length.  Pt amb. full length of recreational pool (approx. 100') x 2 reps today for first time. Increased spasms in RLE noted, probably due to fatigue.  Pt reported he felt good at end of session.    Rehab Potential  Good    Clinical Impairments Affecting Rehab Potential  severity of deficits    PT Frequency  2x / week    PT Duration  12 weeks    PT Treatment/Interventions  ADLs/Self Care Home Management;Aquatic Therapy;DME Instruction;Gait training;Stair training;Functional mobility training;Therapeutic activities;Therapeutic exercise;Balance training;Neuromuscular re-education;Patient/family education;Orthotic Fit/Training;Passive range of motion    PT Next Visit Plan  start to look at STGs (I udated date because I plan to ask for date extension), Tall kneeling if you have +2?  sitting on physioball, continue to discuss possible botox to adductors, transitional movements, gait with R PFRW with 9+7 mm shoe lift, assess for leg length discrepancy, stretching over wedge on left side    Consulted and Agree with Plan of  Care  Patient    Family Member Consulted  mother       Patient will benefit from skilled therapeutic intervention in order to improve the following deficits and impairments:  Abnormal gait, Decreased balance, Decreased coordination, Decreased endurance, Decreased knowledge of use of DME, Decreased mobility, Decreased range of motion, Decreased strength, Impaired perceived functional ability, Impaired flexibility, Impaired tone, Impaired UE functional use, Postural dysfunction  Visit Diagnosis: Other abnormalities of gait and mobility  Unsteadiness on feet  Muscle weakness (generalized)     Problem List Patient Active Problem List   Diagnosis Date Noted  . Reactive airway disease 11/09/2017  . 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 Congenital quadriplegia ([place]) 01/05/2012    Hunter Pinkard, [place], PT 06/20/2018, 7:52 PM  [facility] [address] [place], [place], [address] Phone: [phone]   Fax:  [phone]  Name: Taylor Fernandez MRN: [id number] Date of Birth: 02/26/1996

## 2018-06-22 ENCOUNTER — Ambulatory Visit: Payer: Medicaid Other | Admitting: Rehabilitation

## 2018-06-25 ENCOUNTER — Encounter: Payer: Self-pay | Admitting: Rehabilitation

## 2018-06-25 ENCOUNTER — Ambulatory Visit: Payer: Medicaid Other | Admitting: Rehabilitation

## 2018-06-25 DIAGNOSIS — R2681 Unsteadiness on feet: Secondary | ICD-10-CM

## 2018-06-25 DIAGNOSIS — G8113 Spastic hemiplegia affecting right nondominant side: Secondary | ICD-10-CM

## 2018-06-25 DIAGNOSIS — R2689 Other abnormalities of gait and mobility: Secondary | ICD-10-CM

## 2018-06-25 DIAGNOSIS — M6281 Muscle weakness (generalized): Secondary | ICD-10-CM

## 2018-06-25 DIAGNOSIS — G8112 Spastic hemiplegia affecting left dominant side: Secondary | ICD-10-CM

## 2018-06-25 NOTE — Therapy (Signed)
[place] [address] [place], [place], [address] Phone: [phone]   Fax:  [phone]  Physical Therapy Treatment  Patient Details  Name: Taylor Fernandez MRN: [id number] Date of Birth: 1995/12/17 Referring Provider: Dineen Kid, MD   Encounter Date: 06/25/2018  PT End of Session - 06/25/18 1110    Visit Number  11    Number of Visits  24    Date for PT Re-Evaluation  07/18/18    Authorization Type  Medicaid (approved 24 visits from 7/24-10/15)    Authorization - Visit Number  10    Authorization - Number of Visits  24    PT Start Time  1010    PT Stop Time  1100    PT Time Calculation (min)  50 min    Equipment Utilized During Treatment  Other (comment)   flotation belt   Activity Tolerance  Patient tolerated treatment well    Behavior During Therapy  [facility] for tasks assessed/performed       Past Medical History:  Diagnosis Date  . Cerebral palsy ([place])   . Developmental delay   . Wheelchair bound    Can not stand     Past Surgical History:  Procedure Laterality Date  . EYE MUSCLE SURGERY     22 yrs old and 52-10 yrs old  . KNEE SURGERY     Tendons extended on both legs  . STRABISMUS SURGERY Bilateral 04/03/2015   Procedure: REPAIR STRABISMUS PEDIATRIC;  Surgeon: Everitt Amber, MD;  Location: [place];  Service: Ophthalmology;  Laterality: Bilateral;    There were no vitals filed for this visit.  Subjective Assessment - 06/25/18 1009    Subjective  Pt reports no changes since last visit.     Patient is accompained by:  Family member    Pertinent History  CP (Goes to [employer] 5 days per week 8-11am)    Limitations  House hold activities;Walking;Standing    How long can you stand comfortably?  Is standing for approx 1 hour per day via w/c that is also stander to do school work.     Patient Stated Goals  "To do more for myself."    Currently in Pain?  No/denies                  Gait:  With use of body weight support (only lightly supported in order to better simulate him ambulating with posterior walker-like he will get for home use) addressed ambulation for improved quality with emphasis on postural control and alignment, improved lateral/anterior weight shift in stance and improved L weight bearing.  Note that during first bout of gait (115'), PT did not have shoe lift donned in order to better assess if PT able to get into better postural alignment to decrease tone to assess if tone pulling into leg length discrepancy.  He is able to improve L step with weight bearing however do feel that possible leg length is impacting his ability to get fully forward onto LLE during stance phase of gait.  Therefore during second bout of gait (115') PT donned only 75m shoe lift with marked improvement.  PT also adjusted R handle bar to be higher so that he may use elbow for support and this greatly decreased R lateral flexion.  During first bout of gait, PT provided heavy facilitation under R axilla and anterior chest and also at L pelvis for improved upright posture and R lateral weight shift (  counter force).  Following doing this for several bouts, pt able to make this improvement with less assist.  Also provided assist for placement of LLE and RLE due to adductor tone.  Assist esp during L stance for improved L hip protraction.  He continues to step R LE too soon many times, but improves during session.  PT able to provide less assist with improved upright posture and lateral weight shifting during second bout of gait.  Pt with only mild fatigue following gait.                PT Education - 06/25/18 1110    Education Details  purpose of body weight support     Person(s) Educated  Patient    Methods  Explanation    Comprehension  Verbalized understanding       PT Short Term Goals - 06/25/18 1113      PT SHORT TERM GOAL #1   Title  Pt/mother will  demonstrate understanding of HEP in order to indicate improved functional mobility and improved flexibility.  (Target Date: 06/22/18 date updated to reflect 3 week delay in start)    Baseline  verbally met per pt and mother report    Time  6    Period  Weeks    Status  Achieved      PT SHORT TERM GOAL #2   Title  Pt will perform sit<>stand at S level in order to increase independence with ADLs.     Baseline  S level with UE support, min A otherwise    Time  6    Period  Weeks    Status  Partially Met      PT SHORT TERM GOAL #3   Title  Pt will perform stand pivot transfers at min/guard level in order to indicate improved functional mobility.      Baseline  min to mod A    Time  6    Period  Weeks    Status  New      PT SHORT TERM GOAL #4   Title  Pt will be referred to PM&R for tone management as able to improve functional mobility.      Baseline  PT still trying to get referral from PCP 06/25/18    Time  6    Period  Weeks    Status  Partially Met      PT SHORT TERM GOAL #5   Title  Pt will ambulate x 30' w/ LRAD at min A level in order to indicate improved functional mobility in home.      Baseline  min to mod A for 20' with BPFRW at baseline, min A with body weight support (to simulate posterior walker) x 115' on 06/25/18    Time  6    Period  Weeks    Status  Achieved      PT SHORT TERM GOAL #6   Title  Pt will tolerate standing (at counter) without use of power chair with BUE support x 5 mins in order to increase independence with ADLs.     Baseline  works on standing tolerance from power w/c in [place] position.     Time  6    Period  Weeks    Status  New        PT Long Term Goals - 04/19/18 1919      PT LONG TERM GOAL #1   Title  Pt will perform stand pivot transfer w/  LRAD at S level in order to increase independence at home. (Target Date: 07/18/18)    Baseline  min to mod A    Time  12    Period  Weeks    Status  New    Target Date  07/18/18      PT LONG  TERM GOAL #2   Title  Pt will ambulate x 71' w/ LRAD at min/guard level in order to indicate improved functional independence in home.     Baseline  min to mod A for 20'     Time  12    Period  Weeks    Status  New      PT LONG TERM GOAL #3   Title  Pt will tolerate 10 mins of standing with BUE support (without use of stander) in order to improve independence with ADLs.     Baseline  Only standing with use of power chair/stander at home    Time  12    Period  Weeks    Status  New      PT LONG TERM GOAL #4   Title  Pt/mother will be independent with final HEP in order to indicate improved functional mobility and decreased fall risk.      Baseline  dependent     Time  12    Period  Weeks    Status  New            Plan - 06/25/18 1111    Clinical Impression Statement  Skilled session initiated use of body weight support over ground with BUE support from B handle bars in order to better simulate posterior walker that he will be getting and also so that PT may provide better facilitation without support to stand.  Pt noted to have immediate improvement in postural alignment in standing prior to using body weight support, but also during gait in body weight support.  Also note that he responds very well to verbal and light tactile cues during session.  Also briefly assessed STGs based on mobility in previous recent sessions.  Note that he has met 2/6 STGs, partially met  2/6 STGs.  Will assess standing tolerance/balance and stand pivot transfers at next session.     Rehab Potential  Good    Clinical Impairments Affecting Rehab Potential  severity of deficits    PT Frequency  2x / week    PT Duration  12 weeks    PT Treatment/Interventions  ADLs/Self Care Home Management;Aquatic Therapy;DME Instruction;Gait training;Stair training;Functional mobility training;Therapeutic activities;Therapeutic exercise;Balance training;Neuromuscular re-education;Patient/family education;Orthotic  Fit/Training;Passive range of motion    PT Next Visit Plan  check remaining STGs, body weight support,  Tall kneeling if you have +2?  sitting on physioball, continue to discuss possible botox to adductors, transitional movements, gait with R PFRW with 9+7 mm shoe lift, assess for leg length discrepancy, stretching over wedge on left side    Consulted and Agree with Plan of Care  Patient       Patient will benefit from skilled therapeutic intervention in order to improve the following deficits and impairments:  Abnormal gait, Decreased balance, Decreased coordination, Decreased endurance, Decreased knowledge of use of DME, Decreased mobility, Decreased range of motion, Decreased strength, Impaired perceived functional ability, Impaired flexibility, Impaired tone, Impaired UE functional use, Postural dysfunction  Visit Diagnosis: Other abnormalities of gait and mobility  Muscle weakness (generalized)  Unsteadiness on feet  Spastic hemiplegia affecting left dominant side, unspecified etiology (HCC)  Spastic  hemiplegia affecting right nondominant side, unspecified etiology [facility])     Problem List Patient Active Problem List   Diagnosis Date Noted  . Reactive airway disease 11/09/2017  . 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 Congenital quadriplegia ([place]) 01/05/2012    Cameron Sprang, PT, MPT [facility] [address] [place], [place], [address] Phone: [phone]   Fax:  [phone] 06/25/18, 11:26 AM  Name: Taylor Fernandez MRN: [id number] Date of Birth: 09/08/1996

## 2018-06-29 ENCOUNTER — Ambulatory Visit: Payer: Medicaid Other | Admitting: Rehabilitation

## 2018-06-29 ENCOUNTER — Encounter: Payer: Self-pay | Admitting: Rehabilitation

## 2018-06-29 ENCOUNTER — Ambulatory Visit: Payer: Medicaid Other | Admitting: Podiatry

## 2018-06-29 DIAGNOSIS — R2689 Other abnormalities of gait and mobility: Secondary | ICD-10-CM | POA: Diagnosis not present

## 2018-06-29 DIAGNOSIS — G8113 Spastic hemiplegia affecting right nondominant side: Secondary | ICD-10-CM

## 2018-06-29 DIAGNOSIS — G8112 Spastic hemiplegia affecting left dominant side: Secondary | ICD-10-CM

## 2018-06-29 DIAGNOSIS — R2681 Unsteadiness on feet: Secondary | ICD-10-CM

## 2018-06-29 DIAGNOSIS — M6281 Muscle weakness (generalized): Secondary | ICD-10-CM

## 2018-06-29 NOTE — Therapy (Signed)
[place] [address] [place], [place], [address] Phone: [phone]   Fax:  [phone]  Physical Therapy Treatment  Patient Details  Name: Taylor Fernandez MRN: [id number] Date of Birth: Jun 29, 1996 Referring Provider: Dineen Kid, MD   Encounter Date: 06/29/2018  PT End of Session - 06/29/18 1138    Visit Number  12    Number of Visits  24    Date for PT Re-Evaluation  07/18/18    Authorization Type  Medicaid (approved 24 visits from 7/24-10/15)    Authorization - Visit Number  11    Authorization - Number of Visits  24    PT Start Time  0947    PT Stop Time  1103    PT Time Calculation (min)  45 min    Equipment Utilized During Treatment  Other (comment)   flotation belt   Activity Tolerance  Patient tolerated treatment well    Behavior During Therapy  [facility] for tasks assessed/performed       Past Medical History:  Diagnosis Date  . Cerebral palsy ([place])   . Developmental delay   . Wheelchair bound    Can not stand     Past Surgical History:  Procedure Laterality Date  . EYE MUSCLE SURGERY     22 yrs old and 64-12 yrs old  . KNEE SURGERY     Tendons extended on both legs  . STRABISMUS SURGERY Bilateral 04/03/2015   Procedure: REPAIR STRABISMUS PEDIATRIC;  Surgeon: Everitt Amber, MD;  Location: [place];  Service: Ophthalmology;  Laterality: Bilateral;    There were no vitals filed for this visit.  Subjective Assessment - 06/29/18 1137    Subjective  Pt reports no chnages or pain.     Patient is accompained by:  Family member   in lobby   Pertinent History  CP (Goes to [employer] 5 days per week 8-11am)    Limitations  House hold activities;Walking;Standing    How long can you stand comfortably?  Is standing for approx 1 hour per day via w/c that is also stander to do school work.     Patient Stated Goals  "To do more for myself."    Currently in Pain?  No/denies                  Self Care:  Mother requesting letter written by PT to state exercises/equipment that he would benefit from in order to attend [employer] free of charge (PT unsure of name of program).  Mother stating that it can be typed up stating why he would benefit from specific equipment and use of pool.  PT stating that he would definitely benefit from continuing pool exercises to decrease tone.  PT encouraged mother to ensure how he will be able to get in/out of pool at the [facility] to ensure safety and that mom can do by herself.  Pt and mother verbalized understanding.  PT also encouraged pt/mother to bring new walker to therapy once he receives.    NMR:  Prior to performing transitioning movements, PT worked to decrease trunk and pelvic tone with heavy facilitation at back and shoulders to increase trunk rotation both L and R with cues and assist to prevent compensations at head.  Also addressed pt trying to dissociate trunk and pelvis during session.  He is able to do intermittently, however has difficulty working outside of tone to do so and requires heavy facilitation at times.  Then  worked on sit<>stand for improved forward weight shift and trunk flexion with decreasing UE support.  Pt able to do at min/guard level today with light single UE support.  Also had pt work in standing to achieve postural alignment.  PT having to provide heavy facilitation initially, however he is able to increase initiation of this movement, however is unable to achieve full weight shift on his own.  Also note that when he decreases UE support he tends demo increased trunk flexion/tone.                PT Education - 06/29/18 1138    Education Details  spoke with mother about PT writing letter for approval of pt to attend [employer]    Person(s) Educated  Patient;Parent(s)    Methods  Explanation    Comprehension  Verbalized understanding       PT Short Term Goals - 06/29/18 1140      PT SHORT TERM GOAL #1    Title  Pt/mother will demonstrate understanding of HEP in order to indicate improved functional mobility and improved flexibility.  (Target Date: 06/22/18 date updated to reflect 3 week delay in start)    Baseline  verbally met per pt and mother report    Time  6    Period  Weeks    Status  Achieved      PT SHORT TERM GOAL #2   Title  Pt will perform sit<>stand at S level in order to increase independence with ADLs.     Baseline  S level with UE support, min A otherwise    Time  6    Period  Weeks    Status  Partially Met      PT SHORT TERM GOAL #3   Title  Pt will perform stand pivot transfers at min/guard level in order to indicate improved functional mobility.      Baseline  min A to the L, min/mod to the R    Time  6    Period  Weeks    Status  Not Met      PT SHORT TERM GOAL #4   Title  Pt will be referred to PM&R for tone management as able to improve functional mobility.      Baseline  PT still trying to get referral from PCP 06/25/18    Time  6    Period  Weeks    Status  Partially Met      PT SHORT TERM GOAL #5   Title  Pt will ambulate x 30' w/ LRAD at min A level in order to indicate improved functional mobility in home.      Baseline  min to mod A for 20' with BPFRW at baseline, min A with body weight support (to simulate posterior walker) x 115' on 06/25/18    Time  6    Period  Weeks    Status  Achieved      PT SHORT TERM GOAL #6   Title  Pt will tolerate standing (at counter) without use of power chair with BUE support x 5 mins in order to increase independence with ADLs.     Baseline  works on standing tolerance from power w/c in [place] position.     Time  6    Period  Weeks    Status  New        PT Long Term Goals - 04/19/18 1919      PT LONG TERM GOAL #  1   Title  Pt will perform stand pivot transfer w/ LRAD at S level in order to increase independence at home. (Target Date: 07/18/18)    Baseline  min to mod A    Time  12    Period  Weeks    Status   New    Target Date  07/18/18      PT LONG TERM GOAL #2   Title  Pt will ambulate x 59' w/ LRAD at min/guard level in order to indicate improved functional independence in home.     Baseline  min to mod A for 20'     Time  12    Period  Weeks    Status  New      PT LONG TERM GOAL #3   Title  Pt will tolerate 10 mins of standing with BUE support (without use of stander) in order to improve independence with ADLs.     Baseline  Only standing with use of power chair/stander at home    Time  12    Period  Weeks    Status  New      PT LONG TERM GOAL #4   Title  Pt/mother will be independent with final HEP in order to indicate improved functional mobility and decreased fall risk.      Baseline  dependent     Time  12    Period  Weeks    Status  New            Plan - 06/29/18 1139    Clinical Impression Statement  Skilled session focused on stretching to decrease tone prior to sit<>stand transitions and working into improved postural aligment while in standing along with functional transfers to emphasize R lateral weight shift.  Mother requesting letter to state exercises he can do and would benefit from at [facility] for approval into financial assistance program.  PT to work on and give to next week.     Rehab Potential  Good    Clinical Impairments Affecting Rehab Potential  severity of deficits    PT Frequency  2x / week    PT Duration  12 weeks    PT Treatment/Interventions  ADLs/Self Care Home Management;Aquatic Therapy;DME Instruction;Gait training;Stair training;Functional mobility training;Therapeutic activities;Therapeutic exercise;Balance training;Neuromuscular re-education;Patient/family education;Orthotic Fit/Training;Passive range of motion    PT Next Visit Plan  see if he can stand at counter top, body weight support,  Tall kneeling if you have +2?  sitting on physioball, continue to discuss possible botox to adductors, transitional movements, gait with R PFRW with 9+7 mm shoe  lift, assess for leg length discrepancy, stretching over wedge on left side    Consulted and Agree with Plan of Care  Patient       Patient will benefit from skilled therapeutic intervention in order to improve the following deficits and impairments:  Abnormal gait, Decreased balance, Decreased coordination, Decreased endurance, Decreased knowledge of use of DME, Decreased mobility, Decreased range of motion, Decreased strength, Impaired perceived functional ability, Impaired flexibility, Impaired tone, Impaired UE functional use, Postural dysfunction  Visit Diagnosis: Other abnormalities of gait and mobility  Muscle weakness (generalized)  Unsteadiness on feet  Spastic hemiplegia affecting left dominant side, unspecified etiology (HCC)  Spastic hemiplegia affecting right nondominant side, unspecified etiology ([place])     Problem List Patient Active Problem List   Diagnosis Date Noted  . Reactive airway disease 11/09/2017  . 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 Congenital quadriplegia (  [place]) 01/05/2012    Cameron Sprang, PT, MPT [facility] [address] [place], [place], [address] Phone: [phone]   Fax:  [phone] 06/29/18, 1:08 PM  Name: ETHEL VERONICA MRN: [id number] Date of Birth: [birth year]

## 2018-07-02 ENCOUNTER — Ambulatory Visit: Payer: Medicaid Other | Admitting: Rehabilitation

## 2018-07-03 ENCOUNTER — Encounter: Payer: Self-pay | Admitting: Rehabilitation

## 2018-07-03 ENCOUNTER — Ambulatory Visit: Payer: Medicaid Other | Admitting: Rehabilitation

## 2018-07-03 DIAGNOSIS — R2689 Other abnormalities of gait and mobility: Secondary | ICD-10-CM | POA: Diagnosis not present

## 2018-07-03 DIAGNOSIS — G8112 Spastic hemiplegia affecting left dominant side: Secondary | ICD-10-CM

## 2018-07-03 DIAGNOSIS — M6281 Muscle weakness (generalized): Secondary | ICD-10-CM

## 2018-07-03 DIAGNOSIS — G8113 Spastic hemiplegia affecting right nondominant side: Secondary | ICD-10-CM

## 2018-07-03 DIAGNOSIS — R2681 Unsteadiness on feet: Secondary | ICD-10-CM

## 2018-07-03 NOTE — Therapy (Signed)
[facility] [address] [place], [place], [address] Phone: [phone]   Fax:  [phone]  Physical Therapy Treatment  Patient Details  Name: Taylor Fernandez MRN: [id number] Date of Birth: Sep 22, 1996 Referring Provider: Dineen Kid, MD   Encounter Date: 07/03/2018  PT End of Session - 07/03/18 1554    Visit Number  13    Number of Visits  24    Date for PT Re-Evaluation  07/18/18    Authorization Type  Medicaid (approved 24 visits from 7/24-10/15)    Authorization - Visit Number  12    Authorization - Number of Visits  24    PT Start Time  3976    PT Stop Time  1235    PT Time Calculation (min)  47 min    Equipment Utilized During Treatment  Other (comment)   flotation belt   Activity Tolerance  Patient tolerated treatment well    Behavior During Therapy  [facility] for tasks assessed/performed       Past Medical History:  Diagnosis Date  . Cerebral palsy ([place])   . Developmental delay   . Wheelchair bound    Can not stand     Past Surgical History:  Procedure Laterality Date  . EYE MUSCLE SURGERY     22 yrs old and 88-10 yrs old  . KNEE SURGERY     Tendons extended on both legs  . STRABISMUS SURGERY Bilateral 04/03/2015   Procedure: REPAIR STRABISMUS PEDIATRIC;  Surgeon: Everitt Amber, MD;  Location: No Name;  Service: Ophthalmology;  Laterality: Bilateral;    There were no vitals filed for this visit.  Subjective Assessment - 07/03/18 1553    Subjective  Pt reports no changes since last visit.     Patient is accompained by:  Family member    Pertinent History  CP (Goes to [employer] 5 days per week 8-11am)    Limitations  House hold activities;Walking;Standing    How long can you stand comfortably?  Is standing for approx 1 hour per day via w/c that is also stander to do school work.     Patient Stated Goals  "To do more for myself."    Currently in Pain?  No/denies          Self Care:  Discussed and provided mother with letter regarding recommendations for continued fitness at [facility] during and following therapy.  Pts mother to let PT know if any further recommendations needed.     Gait:  With use of body weight support (only lightly supported in order to better simulate him ambulating with posterior walker-like he will get for home use) addressed ambulation for improved quality with emphasis on postural control and alignment, improved lateral/anterior weight shift in stance and improved L weight bearing.  Did not utilize shoe lift this session and pt able to ambulate 4' with PT provided less support (both manually and with body weight support) only guiding LEs for correct placement.  Cues for forward weight shift over stance limb and also to maintain upright posture with head/chest lifted.  Pt did very well today.  Also performed pre-gait activities to carryover to improved gait with staggered stance having pt shift forward/backward to improve hip extension.                    PT Education - 07/03/18 1554    Education Details  Provided mother with letter regarding recommendations for continued exercise and pool use  at [facility] through funded program.     Person(s) Educated  Patient;Parent(s)    Methods  Explanation;Handout    Comprehension  Verbalized understanding       PT Short Term Goals - 06/29/18 1140      PT SHORT TERM GOAL #1   Title  Pt/mother will demonstrate understanding of HEP in order to indicate improved functional mobility and improved flexibility.  (Target Date: 06/22/18 date updated to reflect 3 week delay in start)    Baseline  verbally met per pt and mother report    Time  6    Period  Weeks    Status  Achieved      PT SHORT TERM GOAL #2   Title  Pt will perform sit<>stand at S level in order to increase independence with ADLs.     Baseline  S level with UE support, min A otherwise    Time  6    Period  Weeks    Status  Partially Met       PT SHORT TERM GOAL #3   Title  Pt will perform stand pivot transfers at min/guard level in order to indicate improved functional mobility.      Baseline  min A to the L, min/mod to the R    Time  6    Period  Weeks    Status  Not Met      PT SHORT TERM GOAL #4   Title  Pt will be referred to PM&R for tone management as able to improve functional mobility.      Baseline  PT still trying to get referral from PCP 06/25/18    Time  6    Period  Weeks    Status  Partially Met      PT SHORT TERM GOAL #5   Title  Pt will ambulate x 30' w/ LRAD at min A level in order to indicate improved functional mobility in home.      Baseline  min to mod A for 20' with BPFRW at baseline, min A with body weight support (to simulate posterior walker) x 115' on 06/25/18    Time  6    Period  Weeks    Status  Achieved      PT SHORT TERM GOAL #6   Title  Pt will tolerate standing (at counter) without use of power chair with BUE support x 5 mins in order to increase independence with ADLs.     Baseline  works on standing tolerance from power w/c in [place] position.     Time  6    Period  Weeks    Status  New        PT Long Term Goals - 04/19/18 1919      PT LONG TERM GOAL #1   Title  Pt will perform stand pivot transfer w/ LRAD at S level in order to increase independence at home. (Target Date: 07/18/18)    Baseline  min to mod A    Time  12    Period  Weeks    Status  New    Target Date  07/18/18      PT LONG TERM GOAL #2   Title  Pt will ambulate x 25' w/ LRAD at min/guard level in order to indicate improved functional independence in home.     Baseline  min to mod A for 20'     Time  12    Period  Weeks  Status  New      PT LONG TERM GOAL #3   Title  Pt will tolerate 10 mins of standing with BUE support (without use of stander) in order to improve independence with ADLs.     Baseline  Only standing with use of power chair/stander at home    Time  12    Period  Weeks    Status  New       PT LONG TERM GOAL #4   Title  Pt/mother will be independent with final HEP in order to indicate improved functional mobility and decreased fall risk.      Baseline  dependent     Time  12    Period  Weeks    Status  New            Plan - 07/03/18 1555    Clinical Impression Statement  Skilled session continues to utilize body weight support system over ground in order to better simulate gait with posterior walker.  Ambulated with system along with working on improved postural control and improved weight shifting in standing to carryover to gait.  Provided mother with letter regarding recommendations for funded [employer] program for continued fitness during session.     Rehab Potential  Good    Clinical Impairments Affecting Rehab Potential  severity of deficits    PT Frequency  2x / week    PT Duration  12 weeks    PT Treatment/Interventions  ADLs/Self Care Home Management;Aquatic Therapy;DME Instruction;Gait training;Stair training;Functional mobility training;Therapeutic activities;Therapeutic exercise;Balance training;Neuromuscular re-education;Patient/family education;Orthotic Fit/Training;Passive range of motion    PT Next Visit Plan  see if he can stand at counter top, body weight support,  Tall kneeling if you have +2?  sitting on physioball, continue to discuss possible botox to adductors, transitional movements, gait with R PFRW with 9+7 mm shoe lift, assess for leg length discrepancy, stretching over wedge on left side    Consulted and Agree with Plan of Care  Patient       Patient will benefit from skilled therapeutic intervention in order to improve the following deficits and impairments:  Abnormal gait, Decreased balance, Decreased coordination, Decreased endurance, Decreased knowledge of use of DME, Decreased mobility, Decreased range of motion, Decreased strength, Impaired perceived functional ability, Impaired flexibility, Impaired tone, Impaired UE functional use, Postural  dysfunction  Visit Diagnosis: Other abnormalities of gait and mobility  Muscle weakness (generalized)  Unsteadiness on feet  Spastic hemiplegia affecting left dominant side, unspecified etiology (HCC)  Spastic hemiplegia affecting right nondominant side, unspecified etiology ([place])     Problem List Patient Active Problem List   Diagnosis Date Noted  . Reactive airway disease 11/09/2017  . 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 Congenital quadriplegia ([place]) 01/05/2012   Cameron Sprang, PT, MPT [facility] [address] [place], [place], [address] Phone: [phone]   Fax:  [phone] 07/03/18, 4:07 PM  Name: JEVONTE CLANTON MRN: [id number] Date of Birth: 01/18/1996

## 2018-07-04 ENCOUNTER — Ambulatory Visit: Payer: Medicaid Other | Admitting: Physical Therapy

## 2018-07-04 DIAGNOSIS — R2689 Other abnormalities of gait and mobility: Secondary | ICD-10-CM

## 2018-07-04 DIAGNOSIS — M6281 Muscle weakness (generalized): Secondary | ICD-10-CM

## 2018-07-04 NOTE — Therapy (Signed)
[facility] [address] [place], [place], [address] Phone: [phone]   Fax:  [phone]  Physical Therapy Treatment  Patient Details  Name: Taylor Fernandez MRN: [id number] Date of Birth: [birth year] Referring Provider: Dineen Kid, MD   Encounter Date: 07/04/2018  PT End of Session - 07/04/18 1949    Visit Number  14    Number of Visits  24    Date for PT Re-Evaluation  07/18/18    Authorization Type  Medicaid (approved 24 visits from 7/24-10/15)    Authorization - Visit Number  13    Authorization - Number of Visits  24    PT Start Time  1300    PT Stop Time  1345    PT Time Calculation (min)  45 min    Equipment Utilized During Treatment  Other (comment)   flotation belt used for first 15" of session in pool   Activity Tolerance  Patient tolerated treatment well    Behavior During Therapy  [facility] for tasks assessed/performed       Past Medical History:  Diagnosis Date  . Cerebral palsy ([place])   . Developmental delay   . Wheelchair bound    Can not stand     Past Surgical History:  Procedure Laterality Date  . EYE MUSCLE SURGERY     22 yrs old and 20-10 yrs old  . KNEE SURGERY     Tendons extended on both legs  . STRABISMUS SURGERY Bilateral 04/03/2015   Procedure: REPAIR STRABISMUS PEDIATRIC;  Surgeon: Everitt Amber, MD;  Location: [facility];  Service: Ophthalmology;  Laterality: Bilateral;    There were no vitals filed for this visit.  Subjective Assessment - 07/04/18 1948    Subjective  Pt reports no changes since previous PT session - states he is ready to get in the pool    Patient is accompained by:  Family member    Pertinent History  CP (Goes to [place] 5 days per week 8-11am)    Patient Stated Goals  "To do more for myself."    Currently in Pain?  No/denies        Aquatic Therapy:  Floreen Comber, SPT present during session Pool temp. 87.5 degrees  Pt entered and exited pool  by pool wheelchair - transferred from his wheelchair to pool wheelchair using stand pivot transfer/lift performed by pt's mother   Pt gait trained using water walker with tactile cues/manual facilitation on Rt lateral upper trunk in Rt axilla region for Rt trunk elongation and tactile cue on Lt pelvis for adduction Flotation belt used initially - approx. 35' x 2 reps; flotation belt removed after these initial 2 laps to facilitate increased trunk control and dynamic balance with less support but with buoyancy of water for support     Pt performed sit to stand x 10 reps using bench in pool - min assist with cues to lean anteriorly  Pt performed marching in place with use of water walker for support for weight shifting onto stance leg and for improved dynamic standing balance  Practiced standing statically at side of pool with bil. UE support with pt attempting to stand upright and keep Rt shoulder elevated for more erect posture - attempting to hold statically 3 secs x 3 reps  Rt lateral trunk stretch with use of pool noodle in seated position on pool bench -  rotating to left side - 10 sec hold x 3 reps for Rt lateral  trunk stretching   Pt performed standing hip abduction and hip extension 10 reps each leg with bil. UE support on edge of pool                 PT Short Term Goals - 06/29/18 1140      PT SHORT TERM GOAL #1   Title  Pt/mother will demonstrate understanding of HEP in order to indicate improved functional mobility and improved flexibility.  (Target Date: 06/22/18 date updated to reflect 3 week delay in start)    Baseline  verbally met per pt and mother report    Time  6    Period  Weeks    Status  Achieved      PT SHORT TERM GOAL #2   Title  Pt will perform sit<>stand at S level in order to increase independence with ADLs.     Baseline  S level with UE support, min A otherwise    Time  6    Period  Weeks    Status  Partially Met      PT SHORT TERM GOAL #3    Title  Pt will perform stand pivot transfers at min/guard level in order to indicate improved functional mobility.      Baseline  min A to the L, min/mod to the R    Time  6    Period  Weeks    Status  Not Met      PT SHORT TERM GOAL #4   Title  Pt will be referred to PM&R for tone management as able to improve functional mobility.      Baseline  PT still trying to get referral from PCP 06/25/18    Time  6    Period  Weeks    Status  Partially Met      PT SHORT TERM GOAL #5   Title  Pt will ambulate x 30' w/ LRAD at min A level in order to indicate improved functional mobility in home.      Baseline  min to mod A for 20' with BPFRW at baseline, min A with body weight support (to simulate posterior walker) x 115' on 06/25/18    Time  6    Period  Weeks    Status  Achieved      PT SHORT TERM GOAL #6   Title  Pt will tolerate standing (at counter) without use of power chair with BUE support x 5 mins in order to increase independence with ADLs.     Baseline  works on standing tolerance from power w/c in [place] position.     Time  6    Period  Weeks    Status  New        PT Long Term Goals - 04/19/18 1919      PT LONG TERM GOAL #1   Title  Pt will perform stand pivot transfer w/ LRAD at S level in order to increase independence at home. (Target Date: 07/18/18)    Baseline  min to mod A    Time  12    Period  Weeks    Status  New    Target Date  07/18/18      PT LONG TERM GOAL #2   Title  Pt will ambulate x 31' w/ LRAD at min/guard level in order to indicate improved functional independence in home.     Baseline  min to mod A for 20'     Time  12    Period  Weeks    Status  New      PT LONG TERM GOAL #3   Title  Pt will tolerate 10 mins of standing with BUE support (without use of stander) in order to improve independence with ADLs.     Baseline  Only standing with use of power chair/stander at home    Time  12    Period  Weeks    Status  New      PT LONG TERM GOAL #4    Title  Pt/mother will be independent with final HEP in order to indicate improved functional mobility and decreased fall risk.      Baseline  dependent     Time  12    Period  Weeks    Status  New            Plan - 07/04/18 1950    Clinical Impression Statement  Pt's posture improving with pt demonstrating ability to stand more erect - continued use of tactile cue on Rt upper trunk/shoulder is needed to facilitate upright posture and also tactile cue to adduct left hip. Flotation belt removed after approx. 15" into session so that pt would be challenged more in maintaining balance & trunk control.      Rehab Potential  Good    Clinical Impairments Affecting Rehab Potential  severity of deficits    PT Frequency  2x / week    PT Duration  12 weeks    PT Treatment/Interventions  ADLs/Self Care Home Management;Aquatic Therapy;DME Instruction;Gait training;Stair training;Functional mobility training;Therapeutic activities;Therapeutic exercise;Balance training;Neuromuscular re-education;Patient/family education;Orthotic Fit/Training;Passive range of motion    PT Next Visit Plan  see if he can stand at counter top, body weight support,  Tall kneeling if you have +2?  sitting on physioball, continue to discuss possible botox to adductors, transitional movements, gait with R PFRW with 9+7 mm shoe lift, assess for leg length discrepancy, stretching over wedge on left side    Consulted and Agree with Plan of Care  Patient    Family Member Consulted  mother       Patient will benefit from skilled therapeutic intervention in order to improve the following deficits and impairments:  Abnormal gait, Decreased balance, Decreased coordination, Decreased endurance, Decreased knowledge of use of DME, Decreased mobility, Decreased range of motion, Decreased strength, Impaired perceived functional ability, Impaired flexibility, Impaired tone, Impaired UE functional use, Postural dysfunction  Visit  Diagnosis: Other abnormalities of gait and mobility  Muscle weakness (generalized)     Problem List Patient Active Problem List   Diagnosis Date Noted  . Reactive airway disease 11/09/2017  . 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 Congenital quadriplegia ([place]) 01/05/2012    Devario Bucklew, [place], PT 07/04/2018, 7:56 PM  [facility] [address] [place], [place], [address] Phone: [phone]   Fax:  [phone]  Name: DELLAS GUARD MRN: [id number] Date of Birth: 1996-09-03

## 2018-07-05 ENCOUNTER — Ambulatory Visit: Payer: Medicaid Other | Admitting: Rehabilitation

## 2018-07-09 ENCOUNTER — Ambulatory Visit: Payer: Medicaid Other | Admitting: Rehabilitation

## 2018-07-13 ENCOUNTER — Ambulatory Visit: Payer: Medicaid Other | Attending: Family Medicine | Admitting: Rehabilitation

## 2018-07-13 DIAGNOSIS — G8113 Spastic hemiplegia affecting right nondominant side: Secondary | ICD-10-CM | POA: Insufficient documentation

## 2018-07-13 DIAGNOSIS — R2689 Other abnormalities of gait and mobility: Secondary | ICD-10-CM | POA: Insufficient documentation

## 2018-07-13 DIAGNOSIS — R278 Other lack of coordination: Secondary | ICD-10-CM | POA: Insufficient documentation

## 2018-07-13 DIAGNOSIS — R2681 Unsteadiness on feet: Secondary | ICD-10-CM | POA: Insufficient documentation

## 2018-07-13 DIAGNOSIS — G8112 Spastic hemiplegia affecting left dominant side: Secondary | ICD-10-CM | POA: Insufficient documentation

## 2018-07-13 DIAGNOSIS — M6281 Muscle weakness (generalized): Secondary | ICD-10-CM | POA: Insufficient documentation

## 2018-07-17 ENCOUNTER — Encounter: Payer: Self-pay | Admitting: Rehabilitation

## 2018-07-17 ENCOUNTER — Ambulatory Visit: Payer: Medicaid Other | Admitting: Rehabilitation

## 2018-07-17 DIAGNOSIS — R2681 Unsteadiness on feet: Secondary | ICD-10-CM

## 2018-07-17 DIAGNOSIS — M6281 Muscle weakness (generalized): Secondary | ICD-10-CM

## 2018-07-17 DIAGNOSIS — G8112 Spastic hemiplegia affecting left dominant side: Secondary | ICD-10-CM

## 2018-07-17 DIAGNOSIS — R278 Other lack of coordination: Secondary | ICD-10-CM | POA: Diagnosis present

## 2018-07-17 DIAGNOSIS — R2689 Other abnormalities of gait and mobility: Secondary | ICD-10-CM | POA: Diagnosis not present

## 2018-07-17 DIAGNOSIS — G8113 Spastic hemiplegia affecting right nondominant side: Secondary | ICD-10-CM | POA: Diagnosis present

## 2018-07-17 NOTE — Therapy (Signed)
[facility] [address] [place], [place], [address] Phone: [phone]   Fax:  [phone]  Physical Therapy Treatment  Patient Details  Name: Taylor Fernandez MRN: [id number] Date of Birth: [birth year] Referring Provider (PT): Dineen Kid, MD   Encounter Date: 07/17/2018  PT End of Session - 07/17/18 1223    Visit Number  15    Number of Visits  24    Date for PT Re-Evaluation  07/18/18    Authorization Type  Medicaid (approved 24 visits from 7/24-10/15)    Authorization - Visit Number  14    Authorization - Number of Visits  24    PT Start Time  9794    PT Stop Time  1218    PT Time Calculation (min)  30 min    Equipment Utilized During Treatment  Other (comment)   flotation belt used for first 15" of session in pool   Activity Tolerance  Patient tolerated treatment well    Behavior During Therapy  [facility] for tasks assessed/performed       Past Medical History:  Diagnosis Date  . Cerebral palsy ([place])   . Developmental delay   . Wheelchair bound    Can not stand     Past Surgical History:  Procedure Laterality Date  . EYE MUSCLE SURGERY     22 yrs old and 58-10 yrs old  . KNEE SURGERY     Tendons extended on both legs  . STRABISMUS SURGERY Bilateral 04/03/2015   Procedure: REPAIR STRABISMUS PEDIATRIC;  Surgeon: Everitt Amber, MD;  Location: [place];  Service: Ophthalmology;  Laterality: Bilateral;    There were no vitals filed for this visit.  Subjective Assessment - 07/17/18 1222    Subjective  Pt reports feeling well today, no changes since last session.  Note that mom reports they get walker on Oct 28th.     Patient is accompained by:  Family member    Pertinent History  CP (Goes to [employer] 5 days per week 8-11am)    Limitations  House hold activities;Walking;Standing    How long can you stand comfortably?  Is standing for approx 1 hour per day via w/c that is also stander to do school  work.     Patient Stated Goals  "To do more for myself."    Currently in Pain?  No/denies             NMR:  Continue to address improvement of postural control and alignment in tall kneeling position.  Worked with pt in tall kneeling with varying UE support to chair seat placed anterior to pt vs arm rest of chair to increase upright posture.  Provided facilitation at chest/rib cage and pelvis to improve upright posture with initiation at pelvis vs head/trunk.  Pt able to demonstrate marked improvement in ability to achieve and maintain upright posture.  Then worked on maintaining upright posture while facilitating improved R lateral weight shift.  Provided max fading to min facilitation during session to allow pt increased time to initiate and achieve position.  Note that he is able to initiate R lateral weight shift following max facilitation, however continues to have difficulty maintaining and moving past midline.  Ended session with transitional movements from tall kneeling to partial L side sit and back to midline.  Again, worked to provide max facilitation fading to min with moderate ability to carryover.  Transferred back to w/c via stand pivot at end of session  with marked improvement in ability to achieve and maintain upright posture with continued cues and also performing lateral weight shifts for stepping to w/c.                       PT Education - 07/17/18 1222    Education Details  Education to mother for her to join pt in pool at next session in order to begin giving HEP to move away from pool in therapy to focus on gait and mobility with gait trainer.     Person(s) Educated  Patient;Parent(s)    Methods  Explanation    Comprehension  Verbalized understanding       PT Short Term Goals - 06/29/18 1140      PT SHORT TERM GOAL #1   Title  Pt/mother will demonstrate understanding of HEP in order to indicate improved functional mobility and improved flexibility.   (Target Date: 06/22/18 date updated to reflect 3 week delay in start)    Baseline  verbally met per pt and mother report    Time  6    Period  Weeks    Status  Achieved      PT SHORT TERM GOAL #2   Title  Pt will perform sit<>stand at S level in order to increase independence with ADLs.     Baseline  S level with UE support, min A otherwise    Time  6    Period  Weeks    Status  Partially Met      PT SHORT TERM GOAL #3   Title  Pt will perform stand pivot transfers at min/guard level in order to indicate improved functional mobility.      Baseline  min A to the L, min/mod to the R    Time  6    Period  Weeks    Status  Not Met      PT SHORT TERM GOAL #4   Title  Pt will be referred to PM&R for tone management as able to improve functional mobility.      Baseline  PT still trying to get referral from PCP 06/25/18    Time  6    Period  Weeks    Status  Partially Met      PT SHORT TERM GOAL #5   Title  Pt will ambulate x 30' w/ LRAD at min A level in order to indicate improved functional mobility in home.      Baseline  min to mod A for 20' with BPFRW at baseline, min A with body weight support (to simulate posterior walker) x 115' on 06/25/18    Time  6    Period  Weeks    Status  Achieved      PT SHORT TERM GOAL #6   Title  Pt will tolerate standing (at counter) without use of power chair with BUE support x 5 mins in order to increase independence with ADLs.     Baseline  works on standing tolerance from power w/c in [place] position.     Time  6    Period  Weeks    Status  New        PT Long Term Goals - 04/19/18 1919      PT LONG TERM GOAL #1   Title  Pt will perform stand pivot transfer w/ LRAD at S level in order to increase independence at home. (Target Date: 07/18/18)    Baseline  min to mod A    Time  12    Period  Weeks    Status  New    Target Date  07/18/18      PT LONG TERM GOAL #2   Title  Pt will ambulate x 78' w/ LRAD at min/guard level in order to  indicate improved functional independence in home.     Baseline  min to mod A for 20'     Time  12    Period  Weeks    Status  New      PT LONG TERM GOAL #3   Title  Pt will tolerate 10 mins of standing with BUE support (without use of stander) in order to improve independence with ADLs.     Baseline  Only standing with use of power chair/stander at home    Time  12    Period  Weeks    Status  New      PT LONG TERM GOAL #4   Title  Pt/mother will be independent with final HEP in order to indicate improved functional mobility and decreased fall risk.      Baseline  dependent     Time  12    Period  Weeks    Status  New            Plan - 07/17/18 1224    Clinical Impression Statement  Skilled session focused on NMR in tall kneeling to address more active improvement of postural control and alignment.  Pt demos improvement with ability to initiate upright posture along with ability to initiate R lateral weight shift.  Continues to have difficulty to shift past midline.     Rehab Potential  Good    Clinical Impairments Affecting Rehab Potential  severity of deficits    PT Frequency  2x / week    PT Duration  12 weeks    PT Treatment/Interventions  ADLs/Self Care Home Management;Aquatic Therapy;DME Instruction;Gait training;Stair training;Functional mobility training;Therapeutic activities;Therapeutic exercise;Balance training;Neuromuscular re-education;Patient/family education;Orthotic Fit/Training;Passive range of motion    PT Next Visit Plan  see if he can stand at counter top, body weight support,  Tall kneeling if you have +2?  sitting on physioball, continue to discuss possible botox to adductors, transitional movements, gait with R PFRW with 9+7 mm shoe lift, assess for leg length discrepancy, stretching over wedge on left side    Consulted and Agree with Plan of Care  Patient    Family Member Consulted  mother       Patient will benefit from skilled therapeutic intervention  in order to improve the following deficits and impairments:  Abnormal gait, Decreased balance, Decreased coordination, Decreased endurance, Decreased knowledge of use of DME, Decreased mobility, Decreased range of motion, Decreased strength, Impaired perceived functional ability, Impaired flexibility, Impaired tone, Impaired UE functional use, Postural dysfunction  Visit Diagnosis: Other abnormalities of gait and mobility  Muscle weakness (generalized)  Unsteadiness on feet  Spastic hemiplegia affecting left dominant side, unspecified etiology (HCC)  Spastic hemiplegia affecting right nondominant side, unspecified etiology ([place])     Problem List Patient Active Problem List   Diagnosis Date Noted  . Reactive airway disease 11/09/2017  . 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 Congenital quadriplegia ([place]) 01/05/2012    Cameron Sprang, PT, MPT [facility] [address] [place], [place], [address] Phone: [phone]   Fax:  [phone] 07/17/18, 12:33 PM  Name: TUVIA WOODRICK MRN: [id number] Date  of Birth: 05/16/1996

## 2018-07-19 ENCOUNTER — Ambulatory Visit: Payer: Medicaid Other | Admitting: Physical Therapy

## 2018-07-20 ENCOUNTER — Ambulatory Visit: Payer: Medicaid Other | Admitting: Podiatry

## 2018-07-20 DIAGNOSIS — B351 Tinea unguium: Secondary | ICD-10-CM

## 2018-07-20 DIAGNOSIS — M79676 Pain in unspecified toe(s): Secondary | ICD-10-CM | POA: Diagnosis not present

## 2018-07-20 DIAGNOSIS — M216X1 Other acquired deformities of right foot: Secondary | ICD-10-CM | POA: Diagnosis not present

## 2018-07-20 DIAGNOSIS — Q828 Other specified congenital malformations of skin: Secondary | ICD-10-CM

## 2018-07-20 DIAGNOSIS — M898X9 Other specified disorders of bone, unspecified site: Secondary | ICD-10-CM

## 2018-07-20 DIAGNOSIS — M79609 Pain in unspecified limb: Secondary | ICD-10-CM

## 2018-07-20 NOTE — Progress Notes (Signed)
  Subjective:  Patient ID: Taylor Fernandez, male    DOB: 10-12-1995,  MRN: [id number]  Chief Complaint  Patient presents with  . Callouses    3 month callous trim    22 y.o. male presents with the above complaint.  Reports painful callus to the plantar aspect of the right great toe  Also complains of possible ingrown toenail to the right great toe inside border  Review of Systems: Negative except as noted in the HPI. Denies N/V/F/Ch.  Past Medical History:  Diagnosis Date  . Cerebral palsy ([place])   . Developmental delay   . Wheelchair bound    Can not stand     Current Outpatient Medications:  .  ALBUTEROL SULFATE IN, Inhale into the lungs as needed., Disp: , Rfl:  .  baclofen (LIORESAL) 10 MG/5ML SOLN, by Intrathecal route once., Disp: , Rfl:  .  cetirizine HCl (CETIRIZINE HCL ALLERGY CHILD) 5 MG/5ML SOLN, TK 10 ML PO ONCE A DAY, Disp: , Rfl:  .  Cholecalciferol (VITAMIN D3) 5000 units TABS, Take by mouth., Disp: , Rfl:  .  fluticasone (FLONASE) 50 MCG/ACT nasal spray, U 2 SPRAYS NASALLY ONCE D, Disp: , Rfl:  .  hydrocortisone 2.5 % cream, APP EXT AA BID FOR 10 DAYS, Disp: , Rfl:  .  ibuprofen (AF-IBUPROFEN CHILD) 100 MG/5ML suspension, 15 ml po tid prn for pain or swelling (Patient not taking: Reported on 07/20/2018), Disp: 240 mL, Rfl: 0 .  lidocaine-prilocaine (EMLA) cream, Apply topically as directed 30 min prior to pump refills, Disp: , Rfl:  .  sulfamethoxazole-trimethoprim (BACTRIM DS,SEPTRA DS) 800-160 MG tablet, TK 1 T PO BID FOR 10 DAYS, Disp: , Rfl:   Social History   Tobacco Use  Smoking Status Never Smoker    No Known Allergies Objective:  There were no vitals filed for this visit. There is no height or weight on file to calculate BMI. Constitutional Well developed. Well nourished.  Vascular Dorsalis pedis pulses palpable bilaterally. Posterior tibial pulses palpable bilaterally. Capillary refill normal to all digits.  No cyanosis or clubbing  noted. Pedal hair growth normal.  Neurologic Normal speech. Oriented to person, place, and time. Epicritic sensation to light touch grossly present bilaterally.  Dermatologic Nails well groomed and normal in appearance. Ingrowing right hallux nail medial border Pre-ulcerative lesion left hallux IPJ plantarly No skin lesions.  Orthopedic: Normal joint ROM without pain or crepitus bilaterally. No visible deformities. No bony tenderness.   Radiographs: None Assessment:   1. Porokeratosis   2. Pain due to onychomycosis of nail    Plan:  Patient was evaluated and treated and all questions answered.  Pre-ulcerative callus left hallux -Debridement through 12 blade without incident. -Dispensed additional tube foam bunion shield  Ingrowing nail right hallux medial border -Nail debrided and slant back fashion to patient relief.  Return in about 9 weeks (around 09/21/2018) for Pre-ulcerative callus care.

## 2018-07-23 ENCOUNTER — Ambulatory Visit: Payer: Medicaid Other | Admitting: Rehabilitation

## 2018-07-23 DIAGNOSIS — R2681 Unsteadiness on feet: Secondary | ICD-10-CM

## 2018-07-23 DIAGNOSIS — G8112 Spastic hemiplegia affecting left dominant side: Secondary | ICD-10-CM

## 2018-07-23 DIAGNOSIS — M6281 Muscle weakness (generalized): Secondary | ICD-10-CM

## 2018-07-23 DIAGNOSIS — G8113 Spastic hemiplegia affecting right nondominant side: Secondary | ICD-10-CM

## 2018-07-23 DIAGNOSIS — R2689 Other abnormalities of gait and mobility: Secondary | ICD-10-CM | POA: Diagnosis not present

## 2018-07-23 NOTE — Therapy (Signed)
[facility] [address] [place], [place], [address] Phone: [phone]   Fax:  [phone]  Physical Therapy Treatment  Patient Details  Name: Taylor Fernandez MRN: [id number] Date of Birth: 10/17/1995 Referring Provider (PT): Dineen Kid, MD   Encounter Date: 07/23/2018  PT End of Session - 07/23/18 1243    Visit Number  16    Number of Visits  24    Date for PT Re-Evaluation  07/18/18    Authorization Type  Medicaid (approved 24 visits from 7/24-10/15)-asking for more visits-awaiting new approval    Authorization - Visit Number  15    Authorization - Number of Visits  24    PT Start Time  6433    PT Stop Time  1235    PT Time Calculation (min)  47 min    Equipment Utilized During Treatment  Other (comment)   flotation belt used for first 15" of session in pool   Activity Tolerance  Patient tolerated treatment well    Behavior During Therapy  [facility] for tasks assessed/performed       Past Medical History:  Diagnosis Date  . Cerebral palsy ([place])   . Developmental delay   . Wheelchair bound    Can not stand     Past Surgical History:  Procedure Laterality Date  . EYE MUSCLE SURGERY     22 yrs old and 20-10 yrs old  . KNEE SURGERY     Tendons extended on both legs  . STRABISMUS SURGERY Bilateral 04/03/2015   Procedure: REPAIR STRABISMUS PEDIATRIC;  Surgeon: Everitt Amber, MD;  Location: [facility];  Service: Ophthalmology;  Laterality: Bilateral;    There were no vitals filed for this visit.      Self Care:  Had discussion with pt and mother at beginning of session regarding her plans for fitness at the [facility].  She plans to pursue this so that she can get in the pool with pt to do HEP given here at therapy.  Also discussed that she is unsure if new walker will fit into her car.  She reports that she may be able to borrow pts father's Lucianne Lei but plans to get her new Lucianne Lei before end of year.  PT  requested that we have walker delivered here so that we can keep it here to work on gait with it for the next month and then they can transport home.  Mother and pt okay with this plan, PT will call vendor to set up.    TA:  Performed stand pivot transfer to the R from standard chair to w/c during session.  Pt requires mod A with max time to initiate movement and finally needing heavy facilitation for adequate weight shift to step to w/c.   Also assessed standing at counter top so that they may begin to do this at home.  Pt able to stand x 5 mins with BUE support statically and also performing lateral weight shifts as well as reaching with LUE to upper cabinet and sliding further superiorly to encourage more upright posture.    Gait:  Assessed gait today with use of body weight support system but without harness to best set up like posterior walker he will have at end of month.  Pt able to ambulate up to 45' with min A before fatiguing and requiring increased assist level.  Continue to provide cues and facilitation for upright posture, forward hip protraction during stance and assist for  LE placement to avoid adduction/scissoring.                          PT Short Term Goals - 06/29/18 1140      PT SHORT TERM GOAL #1   Title  Pt/mother will demonstrate understanding of HEP in order to indicate improved functional mobility and improved flexibility.  (Target Date: 06/22/18 date updated to reflect 3 week delay in start)    Baseline  verbally met per pt and mother report    Time  6    Period  Weeks    Status  Achieved      PT SHORT TERM GOAL #2   Title  Pt will perform sit<>stand at S level in order to increase independence with ADLs.     Baseline  S level with UE support, min A otherwise    Time  6    Period  Weeks    Status  Partially Met      PT SHORT TERM GOAL #3   Title  Pt will perform stand pivot transfers at min/guard level in order to indicate improved functional  mobility.      Baseline  min A to the L, min/mod to the R    Time  6    Period  Weeks    Status  Not Met      PT SHORT TERM GOAL #4   Title  Pt will be referred to PM&R for tone management as able to improve functional mobility.      Baseline  PT still trying to get referral from PCP 06/25/18    Time  6    Period  Weeks    Status  Partially Met      PT SHORT TERM GOAL #5   Title  Pt will ambulate x 30' w/ LRAD at min A level in order to indicate improved functional mobility in home.      Baseline  min to mod A for 20' with BPFRW at baseline, min A with body weight support (to simulate posterior walker) x 115' on 06/25/18    Time  6    Period  Weeks    Status  Achieved      PT SHORT TERM GOAL #6   Title  Pt will tolerate standing (at counter) without use of power chair with BUE support x 5 mins in order to increase independence with ADLs.     Baseline  works on standing tolerance from power w/c in [place] position.     Time  6    Period  Weeks    Status  New        PT Long Term Goals - 07/23/18 1217      PT LONG TERM GOAL #1   Title  Pt will perform stand pivot transfer w/ LRAD at S level in order to increase independence at home. (Target Date: 07/18/18)    Baseline  Continues to vary between min A (to the L) and mod A to the R 07/23/18    Time  12    Period  Weeks    Status  Not Met      PT LONG TERM GOAL #2   Title  Pt will ambulate x 87' w/ LRAD at min/guard level in order to indicate improved functional independence in home.     Baseline  min A (as able) with body weight support system without harness for 45' 07/23/18  Time  12    Period  Weeks    Status  Partially Met      PT LONG TERM GOAL #3   Title  Pt will tolerate 10 mins of standing with BUE support (without use of stander) in order to improve independence with ADLs.     Baseline  Able to stand at counter top for 5 mins with BUE support on 07/23/18    Time  12    Period  Weeks    Status  Partially Met       PT LONG TERM GOAL #4   Title  Pt/mother will be independent with final HEP in order to indicate improved functional mobility and decreased fall risk.      Baseline  Pt and mother verbalize independence with stretching HEP 07/23/18    Time  12    Period  Weeks    Status  Achieved            Plan - 07/23/18 1246    Clinical Impression Statement  Skilled session focused on assessment of LTGs in order to request for more visits.  Note that he has met 1/4 LTGs, partially meeting gait and standing goal, however did not meet transfer goal.  Pt is getting walker delievered at end of month and will benefit from ongoing therapy with and without walker to increase independence at home.     Rehab Potential  Good    Clinical Impairments Affecting Rehab Potential  severity of deficits    PT Frequency  2x / week    PT Duration  12 weeks    PT Treatment/Interventions  ADLs/Self Care Home Management;Aquatic Therapy;DME Instruction;Gait training;Stair training;Functional mobility training;Therapeutic activities;Therapeutic exercise;Balance training;Neuromuscular re-education;Patient/family education;Orthotic Fit/Training;Passive range of motion    PT Next Visit Plan  see if he can stand at counter top, body weight support,  Tall kneeling if you have +2?  sitting on physioball, continue to discuss possible botox to adductors, transitional movements, gait with R PFRW with 9+7 mm shoe lift, assess for leg length discrepancy, stretching over wedge on left side    Consulted and Agree with Plan of Care  Patient    Family Member Consulted  mother       Patient will benefit from skilled therapeutic intervention in order to improve the following deficits and impairments:  Abnormal gait, Decreased balance, Decreased coordination, Decreased endurance, Decreased knowledge of use of DME, Decreased mobility, Decreased range of motion, Decreased strength, Impaired perceived functional ability, Impaired flexibility,  Impaired tone, Impaired UE functional use, Postural dysfunction  Visit Diagnosis: Other abnormalities of gait and mobility  Muscle weakness (generalized)  Unsteadiness on feet  Spastic hemiplegia affecting left dominant side, unspecified etiology (HCC)  Spastic hemiplegia affecting right nondominant side, unspecified etiology ([place])     Problem List Patient Active Problem List   Diagnosis Date Noted  . Reactive airway disease 11/09/2017  . 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 Congenital quadriplegia ([place]) 01/05/2012    Cameron Sprang, PT, MPT [facility] [address] [place], [place], [address] Phone: [phone]   Fax:  [phone] 07/23/18, 12:53 PM  Name: NIKASH MORTENSEN MRN: [id number] Date of Birth: 1996-05-18

## 2018-07-24 ENCOUNTER — Ambulatory Visit: Payer: Medicaid Other | Admitting: Rehabilitation

## 2018-07-25 ENCOUNTER — Ambulatory Visit: Payer: Medicaid Other | Admitting: Physical Therapy

## 2018-07-26 ENCOUNTER — Other Ambulatory Visit: Payer: Self-pay | Admitting: Oral Surgery

## 2018-07-26 DIAGNOSIS — K116 Mucocele of salivary gland: Secondary | ICD-10-CM

## 2018-07-30 ENCOUNTER — Encounter: Payer: Self-pay | Admitting: Physical Therapy

## 2018-07-30 ENCOUNTER — Ambulatory Visit: Payer: Medicaid Other | Admitting: Physical Therapy

## 2018-07-30 DIAGNOSIS — G8112 Spastic hemiplegia affecting left dominant side: Secondary | ICD-10-CM

## 2018-07-30 DIAGNOSIS — M6281 Muscle weakness (generalized): Secondary | ICD-10-CM

## 2018-07-30 DIAGNOSIS — R2689 Other abnormalities of gait and mobility: Secondary | ICD-10-CM

## 2018-07-30 DIAGNOSIS — G8113 Spastic hemiplegia affecting right nondominant side: Secondary | ICD-10-CM

## 2018-07-30 DIAGNOSIS — R278 Other lack of coordination: Secondary | ICD-10-CM

## 2018-07-30 DIAGNOSIS — R2681 Unsteadiness on feet: Secondary | ICD-10-CM

## 2018-07-30 NOTE — Therapy (Signed)
[facility] [address] [place], [place], [address] Phone: [phone]   Fax:  [phone]  Physical Therapy Treatment  Patient Details  Name: Taylor Fernandez MRN: [id number] Date of Birth: 02/03/1996 Referring Provider (PT): Kevin Via, MD   Encounter Date: 07/30/2018  PT End of Session - 07/30/18 1714    Visit Number  17    Number of Visits  24    Date for PT Re-Evaluation  09/21/18    Authorization Type  Medicaid (approved 24 visits from 7/24-10/15)-asking for more visits-awaiting new approval    Authorization - Visit Number  16    Authorization - Number of Visits  24    PT Start Time  1019    PT Stop Time  1103    PT Time Calculation (min)  44 min    Activity Tolerance  Patient tolerated treatment well    Behavior During Therapy  WFL for tasks assessed/performed       Past Medical History:  Diagnosis Date  . Cerebral palsy (HCC)   . Developmental delay   . Wheelchair bound    Can not stand     Past Surgical History:  Procedure Laterality Date  . EYE MUSCLE SURGERY     22 yrs old and 20-20 yrs old  . KNEE SURGERY     Tendons extended on both legs  . STRABISMUS SURGERY Bilateral 04/03/2015   Procedure: REPAIR STRABISMUS PEDIATRIC;  Surgeon: William Young, MD;  Location: [facility];  Service: Ophthalmology;  Laterality: Bilateral;    There were no vitals filed for this visit.  Subjective Assessment - 07/30/18 1024    Subjective  Pt reports he didn't do much this weekend due to the rain; like walking with body weight support    Patient is accompained by:  Family member    Pertinent History  CP (Goes to [employer] 5 days per week 8-11am)    Limitations  House hold activities;Walking;Standing    How long can you stand comfortably?  Is standing for approx 1 hour per day via w/c that is also stander to do school work.     Patient Stated Goals  "To do more for myself."    Currently in Pain?   No/denies           Access Code: [email or web address]  URL: [email or web address]  Date: 07/30/2018  Prepared by: Audra Potter   Exercises  Shoulder PNF D2 Flexion - 10 reps - 2 sets - 1x daily - 7x weekly  Mini Squat with Counter Support - 10 reps - 2 sets - 1x daily - 7x weekly  Side Stepping with Counter Support - 10 reps - 2 sets - 1x daily - 7x weekly  Retro Step - 10 reps - 2 sets - 1x daily - 7x weekly  Lateral Weight Shift with Arm Raise - 10 reps - 2 sets - 1x daily - 7x weekly  Standing Staggered Push Up with Scapular Retraction at Wall - 10 reps - 2 sets - 1x daily - 7x weekly                       PT Education - 07/30/18 1713    Education Details  standing HEP at countertop/sink    Person(s) Educated  Patient    Methods  Explanation;Demonstration;Handout    Comprehension  Need further instruction       PT Short Term Goals - 06/29/18 1140        PT SHORT TERM GOAL #1   Title  Pt/mother will demonstrate understanding of HEP in order to indicate improved functional mobility and improved flexibility.  (Target Date: 06/22/18 date updated to reflect 3 week delay in start)    Baseline  verbally met per pt and mother report    Time  6    Period  Weeks    Status  Achieved      PT SHORT TERM GOAL #2   Title  Pt will perform sit<>stand at S level in order to increase independence with ADLs.     Baseline  S level with UE support, min A otherwise    Time  6    Period  Weeks    Status  Partially Met      PT SHORT TERM GOAL #3   Title  Pt will perform stand pivot transfers at min/guard level in order to indicate improved functional mobility.      Baseline  min A to the L, min/mod to the R    Time  6    Period  Weeks    Status  Not Met      PT SHORT TERM GOAL #4   Title  Pt will be referred to PM&R for tone management as able to improve functional mobility.      Baseline  PT still trying to get referral from PCP 06/25/18    Time  6    Period  Weeks     Status  Partially Met      PT SHORT TERM GOAL #5   Title  Pt will ambulate x 30' w/ LRAD at min A level in order to indicate improved functional mobility in home.      Baseline  min to mod A for 20' with BPFRW at baseline, min A with body weight support (to simulate posterior walker) x 115' on 06/25/18    Time  6    Period  Weeks    Status  Achieved      PT SHORT TERM GOAL #6   Title  Pt will tolerate standing (at counter) without use of power chair with BUE support x 5 mins in order to increase independence with ADLs.     Baseline  works on standing tolerance from power w/c in [place] position.     Time  6    Period  Weeks    Status  New        PT Long Term Goals - 07/23/18 1217      PT LONG TERM GOAL #1   Title  Pt will perform stand pivot transfer w/ LRAD at S level to the L and min A to the R in order to increase independence at home. (Target Date: by 12th visit starting after 10/15)    Baseline  Continues to vary between min A (to the L) and mod A to the R 07/23/18    Time  6    Period  Weeks    Status  Revised      PT LONG TERM GOAL #2   Title  Pt will ambulate x 49' w/ LRAD at min/guard level in order to indicate improved functional independence in home.     Baseline  min A (as able) with body weight support system without harness for 45' 07/23/18    Time  6    Period  Weeks    Status  On-going      PT LONG TERM GOAL #3  Title  Pt will tolerate 10 mins of standing with BUE support (without use of stander) in order to improve independence with ADLs.     Baseline  Able to stand at counter top for 5 mins with BUE support on 07/23/18    Time  6    Period  Weeks    Status  On-going      PT LONG TERM GOAL #4   Title  Pt/mother will be independent with final HEP in order to indicate improved functional mobility and decreased fall risk.      Baseline  Pt and mother verbalize independence with stretching HEP 07/23/18    Time  6    Period  Weeks    Status  On-going             Plan - 07/30/18 1714    Clinical Impression Statement  Anticipate putting pt on hold for PT until he receives his RW and can be further gait trained with RW.  Treatment session focused on initiating standing balance and strengthening exercises for pt to do at home with parent's supervision while on hold for PT.  Pt tolerated well requiring only 2 seated rest breaks; required increased assistance to initiate hip extension for step backs.  Will review with mother next session and will discuss plan    Rehab Potential  Good    Clinical Impairments Affecting Rehab Potential  severity of deficits    PT Frequency  2x / week    PT Duration  6 weeks    PT Treatment/Interventions  ADLs/Self Care Home Management;Aquatic Therapy;DME Instruction;Gait training;Stair training;Functional mobility training;Therapeutic activities;Therapeutic exercise;Balance training;Neuromuscular re-education;Patient/family education;Orthotic Fit/Training;Passive range of motion    PT Next Visit Plan  review with mother standing HEP (printed and gave to her last session but some of the pictures don't match what Kairon is doing).  Discuss putting him on hold until they get RW    Consulted and Agree with Plan of Care  Patient    Family Member Consulted  mother       Patient will benefit from skilled therapeutic intervention in order to improve the following deficits and impairments:  Abnormal gait, Decreased balance, Decreased coordination, Decreased endurance, Decreased knowledge of use of DME, Decreased mobility, Decreased range of motion, Decreased strength, Impaired perceived functional ability, Impaired flexibility, Impaired tone, Impaired UE functional use, Postural dysfunction  Visit Diagnosis: Other abnormalities of gait and mobility  Muscle weakness (generalized)  Unsteadiness on feet  Spastic hemiplegia affecting left dominant side, unspecified etiology (HCC)  Spastic hemiplegia affecting right  nondominant side, unspecified etiology (HCC)  Other lack of coordination     Problem List Patient Active Problem List   Diagnosis Date Noted  . Reactive airway disease 11/09/2017  . 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 Congenital quadriplegia (HCC) 01/05/2012   Audra F Potter, PT, DPT 07/30/18    5:19 PM    [facility] [address] [place], [place], [address] Phone: [phone]   Fax:  [phone]  Name: Jamario A Shultis MRN: [id number] Date of Birth: 05/13/1996   

## 2018-07-30 NOTE — Patient Instructions (Signed)
Access Code: [email or web address]  URL: [email or web address]  Date: 07/30/2018  Prepared by: Misty Stanley   Exercises  Shoulder PNF D2 Flexion - 10 reps - 2 sets - 1x daily - 7x weekly  Mini Squat with Counter Support - 10 reps - 2 sets - 1x daily - 7x weekly  Side Stepping with Counter Support - 10 reps - 2 sets - 1x daily - 7x weekly  Retro Step - 10 reps - 2 sets - 1x daily - 7x weekly  Lateral Weight Shift with Arm Raise - 10 reps - 2 sets - 1x daily - 7x weekly  Standing Staggered Push Up with Scapular Retraction at Wall - 10 reps - 2 sets - 1x daily - 7x weekly

## 2018-07-31 ENCOUNTER — Ambulatory Visit
Admission: RE | Admit: 2018-07-31 | Discharge: 2018-07-31 | Disposition: A | Discharge status: Home / Self Care | Payer: Medicaid Other | Source: Ambulatory Visit | Attending: Oral Surgery | Admitting: Oral Surgery

## 2018-07-31 DIAGNOSIS — K116 Mucocele of salivary gland: Secondary | ICD-10-CM

## 2018-07-31 MED ORDER — IOPAMIDOL (ISOVUE-300) INJECTION 61%
75.0000 mL | Freq: Once | INTRAVENOUS | Status: AC | PRN
  Administered 2018-07-31: 75 mL via INTRAVENOUS

## 2018-08-03 ENCOUNTER — Encounter: Payer: Self-pay | Admitting: Rehabilitation

## 2018-08-03 ENCOUNTER — Ambulatory Visit: Payer: Medicaid Other | Admitting: Rehabilitation

## 2018-08-03 DIAGNOSIS — M6281 Muscle weakness (generalized): Secondary | ICD-10-CM

## 2018-08-03 DIAGNOSIS — R2681 Unsteadiness on feet: Secondary | ICD-10-CM

## 2018-08-03 DIAGNOSIS — G8112 Spastic hemiplegia affecting left dominant side: Secondary | ICD-10-CM

## 2018-08-03 DIAGNOSIS — R2689 Other abnormalities of gait and mobility: Secondary | ICD-10-CM

## 2018-08-03 DIAGNOSIS — G8113 Spastic hemiplegia affecting right nondominant side: Secondary | ICD-10-CM

## 2018-08-03 NOTE — Therapy (Signed)
[facility] [address] [place], [place], [address] Phone: [phone]   Fax:  [phone]  Physical Therapy Treatment  Patient Details  Name: Taylor Fernandez MRN: [id number] Date of Birth: August 21, 1996 Referring Provider (PT): Dineen Kid, MD   Encounter Date: 08/03/2018  PT End of Session - 08/03/18 1300    Visit Number  18    Number of Visits  24    Date for PT Re-Evaluation  09/21/18    Authorization Type  Medicaid (12 visits approved from 10/21-12/1-will have to ask for more visits/date extension to cover December)    Authorization - Visit Number  17    Authorization - Number of Visits  24    PT Start Time  7425   pt late to session   PT Stop Time  1100    PT Time Calculation (min)  37 min    Activity Tolerance  Patient tolerated treatment well    Behavior During Therapy  [facility] for tasks assessed/performed       Past Medical History:  Diagnosis Date  . Cerebral palsy ([place])   . Developmental delay   . Wheelchair bound    Can not stand     Past Surgical History:  Procedure Laterality Date  . EYE MUSCLE SURGERY     22 yrs old and 80-10 yrs old  . KNEE SURGERY     Tendons extended on both legs  . STRABISMUS SURGERY Bilateral 04/03/2015   Procedure: REPAIR STRABISMUS PEDIATRIC;  Surgeon: Everitt Amber, MD;  Location: [facility];  Service: Ophthalmology;  Laterality: Bilateral;    There were no vitals filed for this visit.  Subjective Assessment - 08/03/18 1259    Subjective  Pt reports doing well.  Pts mother reports they have been to the gym together.     Patient is accompained by:  Family member    Pertinent History  CP (Goes to [employer] 5 days per week 8-11am)    Limitations  House hold activities;Walking;Standing    How long can you stand comfortably?  Is standing for approx 1 hour per day via w/c that is also stander to do school work.     Patient Stated Goals  "To do more for myself."     Currently in Pain?  No/denies                Access Code: [email or web address]  URL: [email or web address]  Date: 07/30/2018  Prepared by: Misty Stanley   Exercises   Shoulder PNF D2 Flexion - 10 reps - 2 sets - 1x daily - 7x weekly   Mini Squat with Counter Support - 10 reps - 2 sets - 1x daily - 7x weekly   Side Stepping with Counter Support - 10 reps - 2 sets - 1x daily - 7x weekly   Retro Step - 10 reps - 2 sets - 1x daily - 7x weekly   Lateral Weight Shift with Arm Raise - 10 reps - 2 sets - 1x daily - 7x weekly  Standing Staggered Push Up with Scapular Retraction at Wall - 10 reps - 2 sets - 1x daily - 7x weekly    Reviewed HEP with pt and mother present during session. Also had pt mother perform all exercises with pt with cues for how to best assist, and how to allow pt to activate on his own.  Pt and mother return safe demo.  PT Education - 08/03/18 1259    Education Details  POC going forward-keeping next week's appts and then placing on hold for November and having him return in December when walker is delievered.     Person(s) Educated  Patient;Parent(s)    Methods  Explanation;Demonstration    Comprehension  Verbalized understanding;Returned demonstration       PT Short Term Goals - 06/29/18 1140      PT SHORT TERM GOAL #1   Title  Pt/mother will demonstrate understanding of HEP in order to indicate improved functional mobility and improved flexibility.  (Target Date: 06/22/18 date updated to reflect 3 week delay in start)    Baseline  verbally met per pt and mother report    Time  6    Period  Weeks    Status  Achieved      PT SHORT TERM GOAL #2   Title  Pt will perform sit<>stand at S level in order to increase independence with ADLs.     Baseline  S level with UE support, min A otherwise    Time  6    Period  Weeks    Status  Partially Met      PT SHORT TERM GOAL #3   Title  Pt will perform stand pivot transfers at  min/guard level in order to indicate improved functional mobility.      Baseline  min A to the L, min/mod to the R    Time  6    Period  Weeks    Status  Not Met      PT SHORT TERM GOAL #4   Title  Pt will be referred to PM&R for tone management as able to improve functional mobility.      Baseline  PT still trying to get referral from PCP 06/25/18    Time  6    Period  Weeks    Status  Partially Met      PT SHORT TERM GOAL #5   Title  Pt will ambulate x 30' w/ LRAD at min A level in order to indicate improved functional mobility in home.      Baseline  min to mod A for 20' with BPFRW at baseline, min A with body weight support (to simulate posterior walker) x 115' on 06/25/18    Time  6    Period  Weeks    Status  Achieved      PT SHORT TERM GOAL #6   Title  Pt will tolerate standing (at counter) without use of power chair with BUE support x 5 mins in order to increase independence with ADLs.     Baseline  works on standing tolerance from power w/c in [place] position.     Time  6    Period  Weeks    Status  New        PT Long Term Goals - 07/23/18 1217      PT LONG TERM GOAL #1   Title  Pt will perform stand pivot transfer w/ LRAD at S level to the L and min A to the R in order to increase independence at home. (Target Date: by 12th visit starting after 10/15)    Baseline  Continues to vary between min A (to the L) and mod A to the R 07/23/18    Time  6    Period  Weeks    Status  Revised      PT LONG TERM GOAL #  2   Title  Pt will ambulate x 17' w/ LRAD at min/guard level in order to indicate improved functional independence in home.     Baseline  min A (as able) with body weight support system without harness for 45' 07/23/18    Time  6    Period  Weeks    Status  On-going      PT LONG TERM GOAL #3   Title  Pt will tolerate 10 mins of standing with BUE support (without use of stander) in order to improve independence with ADLs.     Baseline  Able to stand at counter  top for 5 mins with BUE support on 07/23/18    Time  6    Period  Weeks    Status  On-going      PT LONG TERM GOAL #4   Title  Pt/mother will be independent with final HEP in order to indicate improved functional mobility and decreased fall risk.      Baseline  Pt and mother verbalize independence with stretching HEP 07/23/18    Time  6    Period  Weeks    Status  On-going            Plan - 08/03/18 1302    Clinical Impression Statement  Skilled session focused on going over standing HEP at counter top with pt and mother (having mother assist during session) for improved [place].  Will plan to see next week so that pt could get set up with pool HEP and see on land next Friday before putting on hold for the month of November and returning once walker has been delivered.     Rehab Potential  Good    Clinical Impairments Affecting Rehab Potential  severity of deficits    PT Frequency  2x / week    PT Duration  6 weeks    PT Treatment/Interventions  ADLs/Self Care Home Management;Aquatic Therapy;DME Instruction;Gait training;Stair training;Functional mobility training;Therapeutic activities;Therapeutic exercise;Balance training;Neuromuscular re-education;Patient/family education;Orthotic Fit/Training;Passive range of motion    PT Next Visit Plan  Check LTGs and place on hold for month of November     Consulted and Agree with Plan of Care  Patient    Family Member Consulted  mother       Patient will benefit from skilled therapeutic intervention in order to improve the following deficits and impairments:  Abnormal gait, Decreased balance, Decreased coordination, Decreased endurance, Decreased knowledge of use of DME, Decreased mobility, Decreased range of motion, Decreased strength, Impaired perceived functional ability, Impaired flexibility, Impaired tone, Impaired UE functional use, Postural dysfunction  Visit Diagnosis: Other abnormalities of gait and mobility  Muscle weakness  (generalized)  Unsteadiness on feet  Spastic hemiplegia affecting left dominant side, unspecified etiology (HCC)  Spastic hemiplegia affecting right nondominant side, unspecified etiology ([place])     Problem List Patient Active Problem List   Diagnosis Date Noted  . Reactive airway disease 11/09/2017  . 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 Congenital quadriplegia ([place]) 01/05/2012    Cameron Sprang, PT, MPT [facility] [address] [place], [place], [address] Phone: [phone]   Fax:  [phone] 08/03/18, 1:06 PM  Name: Taylor Fernandez MRN: [id number] Date of Birth: [birth year]

## 2018-08-03 NOTE — Patient Instructions (Signed)
Access Code: [email or web address]  URL: [email or web address]  Date: 07/30/2018  Prepared by: Misty Stanley   Exercises   Shoulder PNF D2 Flexion - 10 reps - 2 sets - 1x daily - 7x weekly   Mini Squat with Counter Support - 10 reps - 2 sets - 1x daily - 7x weekly   Side Stepping with Counter Support - 10 reps - 2 sets - 1x daily - 7x weekly   Retro Step - 10 reps - 2 sets - 1x daily - 7x weekly   Lateral Weight Shift with Arm Raise - 10 reps - 2 sets - 1x daily - 7x weekly   Standing Staggered Push Up with Scapular Retraction at Wall - 10 reps - 2 sets - 1x daily - 7x weekly

## 2018-08-08 ENCOUNTER — Ambulatory Visit: Payer: Medicaid Other | Admitting: Physical Therapy

## 2018-08-10 ENCOUNTER — Ambulatory Visit: Payer: Medicaid Other | Admitting: Rehabilitation

## 2018-08-13 ENCOUNTER — Ambulatory Visit: Payer: Medicaid Other | Admitting: Rehabilitation

## 2018-08-15 ENCOUNTER — Ambulatory Visit: Payer: Medicaid Other | Attending: Family Medicine | Admitting: Physical Therapy

## 2018-08-15 DIAGNOSIS — R2681 Unsteadiness on feet: Secondary | ICD-10-CM

## 2018-08-15 DIAGNOSIS — R2689 Other abnormalities of gait and mobility: Secondary | ICD-10-CM | POA: Diagnosis present

## 2018-08-15 DIAGNOSIS — M6281 Muscle weakness (generalized): Secondary | ICD-10-CM | POA: Diagnosis present

## 2018-08-15 NOTE — Therapy (Signed)
[facility] [address] [place], [place], [address] Phone: [phone]   Fax:  [phone]  Physical Therapy Treatment  Patient Details  Name: Taylor Fernandez MRN: [id number] Date of Birth: 1996/03/01 Referring Provider (PT): Dineen Kid, MD   Encounter Date: 08/15/2018  PT End of Session - 08/15/18 2037    Visit Number  19   2/12 in this authorization period   Number of Visits  24    Date for PT Re-Evaluation  09/21/18    Authorization Type  Medicaid (12 visits approved from 10/21-12/1-will have to ask for more visits/date extension to cover December)    Authorization - Visit Number  18    Authorization - Number of Visits  24    PT Start Time  5361    PT Stop Time  1630    PT Time Calculation (min)  45 min    Equipment Utilized During Treatment  Other (comment)   flotation belt   Activity Tolerance  Patient tolerated treatment well    Behavior During Therapy  [facility] for tasks assessed/performed       Past Medical History:  Diagnosis Date  . Cerebral palsy ([place])   . Developmental delay   . Wheelchair bound    Can not stand     Past Surgical History:  Procedure Laterality Date  . EYE MUSCLE SURGERY     22 yrs old and 10-10 yrs old  . KNEE SURGERY     Tendons extended on both legs  . STRABISMUS SURGERY Bilateral 04/03/2015   Procedure: REPAIR STRABISMUS PEDIATRIC;  Surgeon: Everitt Amber, MD;  Location: [facility];  Service: Ophthalmology;  Laterality: Bilateral;    There were no vitals filed for this visit.  Subjective Assessment - 08/15/18 2033    Subjective  Mother states they have joined [employer] - plans to use the pool for continued aquatic exercise at this facility; pt's mother, Kia, present and ready to get in pool to assist pt with HEP; states pt's dad will get in pool with pt when they go to [facility]     Patient is accompained by:  Family member    Pertinent History  CP (Goes to [employer] 5 days  per week 8-11am)    Patient Stated Goals  "To do more for myself."    Currently in Pain?  No/denies       Aquatic therapy - pool temp 87.2 degrees  Patient seen for aquatic therapy today.  Treatment took place in water 3.5-4 feet deep depending upon activity.  Pt entered and  exited the pool via wheelchair on wheelchair ramp (mother transferred pt from his wheelchair to pool wheelchair).   Flotation belt placed on pt for safety.  Instructed pt and mother in HEP for continued participation in aquatic exercise at [facility] (pt's mother's choice of community fitness facilities)   Pt ambulated in water with bil. UE support on mother's shoulders for support - approx. 72' forward; cues for weight shift prior to stepping with opposite LE  Backwards amb. With mod assist as pt had difficulty stepping backward with RLE; sideways amb. With use of water walker; pt also used water walker for assistance with forward amb.; trialed use of water noodle for UE support  For forward ambulation  Mother instructed in [place] technique for tone reduction and relaxation - noodle placed under arms with pt wearing flotation belt - pt assisted into supine position Gentle swaying and rocking side to side  for tone reduction Hip abduction and adduction 10 reps each with assistance initially for increased ROM; bicycling LE's 10 reps; bil. Knees to chest with resistance for flexion and extension  Pt performed standing hip abduction, flexion and extension 10 reps each leg with use of buoyancy for increased ROM and support for standing balance;  marching in place 10 reps each leg with bil. UE support  Instructed pt's mother that standing balance could be worked on with unsupported standing in 4' water depth without UE support - cues to stand erect and keep Rt shoulder  elevated as able to do so                     PT Education - 08/15/18 2035    Education Details  instructed pt's mother, Scarlette Ar, in  exercises to continue in pool at [facility] - will get pictures together for HEP     Person(s) Educated  Patient;Parent(s)    Methods  Explanation;Demonstration    Comprehension  Verbalized understanding;Returned demonstration       PT Short Term Goals - 06/29/18 1140      PT SHORT TERM GOAL #1   Title  Pt/mother will demonstrate understanding of HEP in order to indicate improved functional mobility and improved flexibility.  (Target Date: 06/22/18 date updated to reflect 3 week delay in start)    Baseline  verbally met per pt and mother report    Time  6    Period  Weeks    Status  Achieved      PT SHORT TERM GOAL #2   Title  Pt will perform sit<>stand at S level in order to increase independence with ADLs.     Baseline  S level with UE support, min A otherwise    Time  6    Period  Weeks    Status  Partially Met      PT SHORT TERM GOAL #3   Title  Pt will perform stand pivot transfers at min/guard level in order to indicate improved functional mobility.      Baseline  min A to the L, min/mod to the R    Time  6    Period  Weeks    Status  Not Met      PT SHORT TERM GOAL #4   Title  Pt will be referred to PM&R for tone management as able to improve functional mobility.      Baseline  PT still trying to get referral from PCP 06/25/18    Time  6    Period  Weeks    Status  Partially Met      PT SHORT TERM GOAL #5   Title  Pt will ambulate x 30' w/ LRAD at min A level in order to indicate improved functional mobility in home.      Baseline  min to mod A for 20' with BPFRW at baseline, min A with body weight support (to simulate posterior walker) x 115' on 06/25/18    Time  6    Period  Weeks    Status  Achieved      PT SHORT TERM GOAL #6   Title  Pt will tolerate standing (at counter) without use of power chair with BUE support x 5 mins in order to increase independence with ADLs.     Baseline  works on standing tolerance from power w/c in [place] position.     Time  6  Period   Weeks    Status  New        PT Long Term Goals - 07/23/18 1217      PT LONG TERM GOAL #1   Title  Pt will perform stand pivot transfer w/ LRAD at S level to the L and min A to the R in order to increase independence at home. (Target Date: by 12th visit starting after 10/15)    Baseline  Continues to vary between min A (to the L) and mod A to the R 07/23/18    Time  6    Period  Weeks    Status  Revised      PT LONG TERM GOAL #2   Title  Pt will ambulate x 62' w/ LRAD at min/guard level in order to indicate improved functional independence in home.     Baseline  min A (as able) with body weight support system without harness for 45' 07/23/18    Time  6    Period  Weeks    Status  On-going      PT LONG TERM GOAL #3   Title  Pt will tolerate 10 mins of standing with BUE support (without use of stander) in order to improve independence with ADLs.     Baseline  Able to stand at counter top for 5 mins with BUE support on 07/23/18    Time  6    Period  Weeks    Status  On-going      PT LONG TERM GOAL #4   Title  Pt/mother will be independent with final HEP in order to indicate improved functional mobility and decreased fall risk.      Baseline  Pt and mother verbalize independence with stretching HEP 07/23/18    Time  6    Period  Weeks    Status  On-going            Plan - 08/15/18 2040    Clinical Impression Statement  Pt's mother demonstrated aquatic exercises well after initial instruction and demonstration;  pt performed amb. initially with UE support on mother's shoulders, then with use of noodle and finally with use of water walker for UE support with tactile cue for elevation of Lt shoulder and upper trunk.  Pt has limited AROM of Rt hip abductors and extensors due to spasticity and tightness but able to tolerate AAROM from PT as well as from buoyancy of water.      Rehab Potential  Good    Clinical Impairments Affecting Rehab Potential  severity of deficits    PT  Frequency  2x / week    PT Duration  6 weeks    PT Treatment/Interventions  ADLs/Self Care Home Management;Aquatic Therapy;DME Instruction;Gait training;Stair training;Functional mobility training;Therapeutic activities;Therapeutic exercise;Balance training;Neuromuscular re-education;Patient/family education;Orthotic Fit/Training;Passive range of motion    PT Next Visit Plan  Check LTGs and place on hold for month of November     PT Home Exercise Plan  aquatic HEP to be completed - instruction completed on 08-15-18 (need to give pics to pt and mother)     Consulted and Agree with Plan of Care  Patient    Family Member Consulted  mother       Patient will benefit from skilled therapeutic intervention in order to improve the following deficits and impairments:  Abnormal gait, Decreased balance, Decreased coordination, Decreased endurance, Decreased knowledge of use of DME, Decreased mobility, Decreased range of motion, Decreased strength, Impaired perceived functional  ability, Impaired flexibility, Impaired tone, Impaired UE functional use, Postural dysfunction  Visit Diagnosis: Other abnormalities of gait and mobility  Muscle weakness (generalized)  Unsteadiness on feet     Problem List Patient Active Problem List   Diagnosis Date Noted  . Reactive airway disease 11/09/2017  . 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 [place], [place], [address] Phone: [phone]   Fax:  [phone]  Name: WILLFORD RABIDEAU MRN: [id number] Date of Birth: [birth year]

## 2018-08-17 ENCOUNTER — Ambulatory Visit: Payer: Medicaid Other | Admitting: Physical Therapy

## 2018-09-20 ENCOUNTER — Ambulatory Visit: Payer: Medicaid Other | Admitting: Podiatry

## 2018-09-20 DIAGNOSIS — M898X7 Other specified disorders of bone, ankle and foot: Secondary | ICD-10-CM

## 2018-09-20 DIAGNOSIS — Q828 Other specified congenital malformations of skin: Secondary | ICD-10-CM | POA: Diagnosis not present

## 2018-09-20 DIAGNOSIS — M216X1 Other acquired deformities of right foot: Secondary | ICD-10-CM | POA: Diagnosis not present

## 2018-09-20 DIAGNOSIS — G629 Polyneuropathy, unspecified: Secondary | ICD-10-CM

## 2018-09-21 ENCOUNTER — Ambulatory Visit: Payer: Medicaid Other | Admitting: Podiatry

## 2018-09-24 ENCOUNTER — Encounter (HOSPITAL_COMMUNITY): Payer: Self-pay | Admitting: *Deleted

## 2018-09-24 ENCOUNTER — Other Ambulatory Visit: Payer: Self-pay

## 2018-09-24 NOTE — Progress Notes (Signed)
SDW-Pre-op call completed by pt father, Gus, with pt verbal consent. Father denies that pt C/O SOB and chest pain. Father denies that pt is under the care of a cardiologist. Father denies that pt had a a stress test and echo. Father denies that pt had an EKG and chest x ray within the last year. Father denies recent labs. Father made aware to have the pt stop taking vitamins, fish oil and herbal medications.  Do not take any NSAIDs ie: Ibuprofen, Advil, Naproxen (Aleve), Motrin, BC and Goody Powder. Father verbalized understanding of all pre-op instructions. PA, Anesthesiology, asked to review pt history.

## 2018-09-24 NOTE — Anesthesia Preprocedure Evaluation (Addendum)
Anesthesia Evaluation  Patient identified by MRN, date of birth, ID band Patient awake    Reviewed: Allergy & Precautions, H&P , NPO status , Patient's Chart, lab work & pertinent test results  Airway Mallampati: III  TM Distance: >3 FB Neck ROM: Full    Dental no notable dental hx. (+) Teeth Intact, Dental Advisory Given   Pulmonary neg pulmonary ROS,    Pulmonary exam normal breath sounds clear to auscultation       Cardiovascular negative cardio ROS   Rhythm:Regular Rate:Normal     Neuro/Psych negative neurological ROS  negative psych ROS   GI/Hepatic negative GI ROS, Neg liver ROS,   Endo/Other  negative endocrine ROS  Renal/GU negative Renal ROS  negative genitourinary   Musculoskeletal   Abdominal   Peds  Hematology negative hematology ROS (+)   Anesthesia Other Findings   Reproductive/Obstetrics negative OB ROS                            Anesthesia Physical Anesthesia Plan  ASA: II  Anesthesia Plan: General   Post-op Pain Management:    Induction: Intravenous  PONV Risk Score and Plan: 3 and Ondansetron, Dexamethasone and Midazolam  Airway Management Planned: Nasal ETT and Video Laryngoscope Planned  Additional Equipment:   Intra-op Plan:   Post-operative Plan: Extubation in OR  Informed Consent: I have reviewed the patients History and Physical, chart, labs and discussed the procedure including the risks, benefits and alternatives for the proposed anesthesia with the patient or authorized representative who has indicated his/her understanding and acceptance.   Dental advisory given  Plan Discussed with: CRNA  Anesthesia Plan Comments: (Pt has spastic quadriparetic cerebral palsy, wheelchair bound. Follows with pediatric orthopedic surgery at [facility]. Recent  baclofen pump removal on 11/15/2017 at [facility] without complication.)     Anesthesia Quick Evaluation

## 2018-09-24 NOTE — H&P (Signed)
HISTORY AND PHYSICAL  Taylor Fernandez is a 22 y.o. male patient with CC: swelling under tongue. Referred by general dentist for evaluation June 2019.  No diagnosis found.  Past Medical History:  Diagnosis Date  . Cerebral palsy ([place])   . Developmental delay   . Wheelchair bound    Can not stand     No current facility-administered medications for this encounter.    No current outpatient medications on file.   No Known Allergies Active Problems:   * No active hospital problems. *  Vitals: There were no vitals taken for this visit. Lab results:No results found for this or any previous visit (from the past 64 hour(s)). Radiology Results: No results found. General appearance: alert, cooperative, slowed mentation and wheelchair bound Head: Normocephalic, without obvious abnormality, atraumatic Eyes: negative Nose: Nares normal. Septum midline. Mucosa normal. No drainage or sinus tenderness. Throat: large swelling right and left anterior floor of mouth. No trismus. Pharynx clear Neck: no adenopathy, supple, symmetrical, trachea midline and thyroid not enlarged, symmetric, no tenderness/mass/nodules Resp: clear to auscultation bilaterally Cardio: regular rate and rhythm, S1, S2 normal, no murmur, click, rub or gallop  Assessment:22 yo Male Cerebral Palsy, Developmental Delay with floor of mouth ranula. Plan: Excision ranula, GA. Day surgery.   Diona Browner 09/24/2018

## 2018-09-25 ENCOUNTER — Ambulatory Visit (HOSPITAL_COMMUNITY): Payer: Medicaid Other | Admitting: Physician Assistant

## 2018-09-25 ENCOUNTER — Encounter (HOSPITAL_COMMUNITY): Payer: Self-pay | Admitting: Surgery

## 2018-09-25 ENCOUNTER — Encounter (HOSPITAL_COMMUNITY)
Admission: RE | Disposition: A | Discharge status: Home / Self Care | Payer: Self-pay | Source: Home / Self Care | Attending: Oral Surgery

## 2018-09-25 ENCOUNTER — Ambulatory Visit (HOSPITAL_COMMUNITY)
Admission: RE | Admit: 2018-09-25 | Discharge: 2018-09-25 | Disposition: A | Discharge status: Home / Self Care | Payer: Medicaid Other | Attending: Oral Surgery | Admitting: Oral Surgery

## 2018-09-25 DIAGNOSIS — K116 Mucocele of salivary gland: Secondary | ICD-10-CM | POA: Diagnosis present

## 2018-09-25 DIAGNOSIS — M2740 Unspecified cyst of jaw: Secondary | ICD-10-CM | POA: Diagnosis not present

## 2018-09-25 DIAGNOSIS — G809 Cerebral palsy, unspecified: Secondary | ICD-10-CM | POA: Diagnosis not present

## 2018-09-25 DIAGNOSIS — Z993 Dependence on wheelchair: Secondary | ICD-10-CM | POA: Diagnosis not present

## 2018-09-25 HISTORY — DX: Mucocele of salivary gland: K11.6

## 2018-09-25 HISTORY — PX: SALIVARY STONE REMOVAL: SHX5213

## 2018-09-25 SURGERY — REMOVAL, CALCULUS, SALIVARY DUCT
Anesthesia: General | Site: Mouth

## 2018-09-25 MED ORDER — OXYCODONE-ACETAMINOPHEN 5-325 MG PO TABS: 1.0000 | ORAL_TABLET | ORAL | 0 refills | Status: AC | PRN

## 2018-09-25 MED ORDER — EPINEPHRINE PF 1 MG/ML IJ SOLN
INTRAMUSCULAR | Status: AC
  Filled 2018-09-25: qty 1

## 2018-09-25 MED ORDER — PROPOFOL 10 MG/ML IV BOLUS
INTRAVENOUS | Status: DC | PRN
  Administered 2018-09-25: 120 mg via INTRAVENOUS

## 2018-09-25 MED ORDER — CEFAZOLIN SODIUM-DEXTROSE 2-4 GM/100ML-% IV SOLN
2.0000 g | INTRAVENOUS | Status: AC
  Administered 2018-09-25: 2 g via INTRAVENOUS
  Filled 2018-09-25: qty 100

## 2018-09-25 MED ORDER — ROCURONIUM BROMIDE 50 MG/5ML IV SOSY
PREFILLED_SYRINGE | INTRAVENOUS | Status: DC | PRN
  Administered 2018-09-25: 40 mg via INTRAVENOUS
  Administered 2018-09-25: 10 mg via INTRAVENOUS

## 2018-09-25 MED ORDER — ONDANSETRON HCL 4 MG/2ML IJ SOLN
INTRAMUSCULAR | Status: DC | PRN
  Administered 2018-09-25: 4 mg via INTRAVENOUS

## 2018-09-25 MED ORDER — SUGAMMADEX SODIUM 200 MG/2ML IV SOLN
INTRAVENOUS | Status: DC | PRN
  Administered 2018-09-25: 120 mg via INTRAVENOUS

## 2018-09-25 MED ORDER — BACITRACIN ZINC 500 UNIT/GM EX OINT
TOPICAL_OINTMENT | CUTANEOUS | Status: AC
  Filled 2018-09-25: qty 28.35

## 2018-09-25 MED ORDER — DEXAMETHASONE SODIUM PHOSPHATE 10 MG/ML IJ SOLN
INTRAMUSCULAR | Status: AC
  Filled 2018-09-25: qty 1

## 2018-09-25 MED ORDER — MIDAZOLAM HCL 2 MG/2ML IJ SOLN
INTRAMUSCULAR | Status: AC
  Filled 2018-09-25: qty 2

## 2018-09-25 MED ORDER — LIDOCAINE 2% (20 MG/ML) 5 ML SYRINGE
INTRAMUSCULAR | Status: AC
  Filled 2018-09-25: qty 5

## 2018-09-25 MED ORDER — OXYMETAZOLINE HCL 0.05 % NA SOLN
NASAL | Status: AC
  Filled 2018-09-25: qty 15

## 2018-09-25 MED ORDER — ONDANSETRON HCL 4 MG/2ML IJ SOLN
INTRAMUSCULAR | Status: AC
  Filled 2018-09-25: qty 2

## 2018-09-25 MED ORDER — FENTANYL CITRATE (PF) 100 MCG/2ML IJ SOLN
INTRAMUSCULAR | Status: DC | PRN
  Administered 2018-09-25 (×5): 50 ug via INTRAVENOUS

## 2018-09-25 MED ORDER — MIDAZOLAM HCL 2 MG/2ML IJ SOLN
INTRAMUSCULAR | Status: DC | PRN
  Administered 2018-09-25: 2 mg via INTRAVENOUS

## 2018-09-25 MED ORDER — DEXAMETHASONE SODIUM PHOSPHATE 10 MG/ML IJ SOLN
INTRAMUSCULAR | Status: DC | PRN
  Administered 2018-09-25: 10 mg via INTRAVENOUS

## 2018-09-25 MED ORDER — OXYMETAZOLINE HCL 0.05 % NA SOLN
NASAL | Status: DC | PRN
  Administered 2018-09-25: 2 via NASAL

## 2018-09-25 MED ORDER — PROPOFOL 10 MG/ML IV BOLUS
INTRAVENOUS | Status: AC
  Filled 2018-09-25: qty 20

## 2018-09-25 MED ORDER — LIDOCAINE 2% (20 MG/ML) 5 ML SYRINGE
INTRAMUSCULAR | Status: DC | PRN
  Administered 2018-09-25: 40 mg via INTRAVENOUS

## 2018-09-25 MED ORDER — FENTANYL CITRATE (PF) 100 MCG/2ML IJ SOLN: 25.0000 ug | INTRAMUSCULAR | Status: DC | PRN

## 2018-09-25 MED ORDER — FENTANYL CITRATE (PF) 250 MCG/5ML IJ SOLN
INTRAMUSCULAR | Status: AC
  Filled 2018-09-25: qty 5

## 2018-09-25 MED ORDER — LIDOCAINE-EPINEPHRINE 1 %-1:100000 IJ SOLN
INTRAMUSCULAR | Status: DC | PRN
  Administered 2018-09-25: 10 mL

## 2018-09-25 MED ORDER — ROCURONIUM BROMIDE 50 MG/5ML IV SOSY
PREFILLED_SYRINGE | INTRAVENOUS | Status: AC
  Filled 2018-09-25: qty 5

## 2018-09-25 MED ORDER — AMOXICILLIN 500 MG PO CAPS: 500.0000 mg | ORAL_CAPSULE | Freq: Three times a day (TID) | ORAL | 1 refills | Status: AC

## 2018-09-25 MED ORDER — LIDOCAINE-EPINEPHRINE 1 %-1:100000 IJ SOLN
INTRAMUSCULAR | Status: AC
  Filled 2018-09-25: qty 1

## 2018-09-25 MED ORDER — LACTATED RINGERS IV SOLN
INTRAVENOUS | Status: DC
  Administered 2018-09-25: 08:00:00 via INTRAVENOUS

## 2018-09-25 SURGICAL SUPPLY — 44 items
ADH SKN CLS APL DERMABOND .7 (GAUZE/BANDAGES/DRESSINGS)
APPLIER CLIP 9.375 SM OPEN (CLIP)
APR CLP SM 9.3 20 MLT OPN (CLIP)
ATTRACTOMAT 16X20 MAGNETIC DRP (DRAPES) ×2 IMPLANT
BLADE 10 SAFETY STRL DISP (BLADE) ×3 IMPLANT
BLADE SURG 15 STRL LF DISP TIS (BLADE) ×1 IMPLANT
BLADE SURG 15 STRL SS (BLADE) ×3
CANISTER SUCT 3000ML PPV (MISCELLANEOUS) ×3 IMPLANT
CLEANER TIP ELECTROSURG 2X2 (MISCELLANEOUS) ×3 IMPLANT
CLIP APPLIE 9.375 SM OPEN (CLIP) IMPLANT
CORDS BIPOLAR (ELECTRODE) ×3 IMPLANT
COVER SURGICAL LIGHT HANDLE (MISCELLANEOUS) ×2 IMPLANT
COVER WAND RF STERILE (DRAPES) ×3 IMPLANT
CRADLE DONUT ADULT HEAD (MISCELLANEOUS) ×2 IMPLANT
DERMABOND ADVANCED (GAUZE/BANDAGES/DRESSINGS)
DERMABOND ADVANCED .7 DNX12 (GAUZE/BANDAGES/DRESSINGS) IMPLANT
DRAIN JACKSON RD 7FR 3/32 (WOUND CARE) IMPLANT
DRAIN PENROSE 1/4X12 LTX STRL (WOUND CARE) ×2 IMPLANT
ELECT COATED BLADE 2.86 ST (ELECTRODE) ×3 IMPLANT
ELECT REM PT RETURN 9FT ADLT (ELECTROSURGICAL) ×3
ELECTRODE REM PT RTRN 9FT ADLT (ELECTROSURGICAL) ×1 IMPLANT
EVACUATOR SILICONE 100CC (DRAIN) IMPLANT
GAUZE 4X4 16PLY RFD (DISPOSABLE) ×2 IMPLANT
GLOVE BIO SURGEON STRL SZ7.5 (GLOVE) ×4 IMPLANT
GOWN STRL REUS W/ TWL LRG LVL3 (GOWN DISPOSABLE) IMPLANT
GOWN STRL REUS W/TWL LRG LVL3 (GOWN DISPOSABLE) ×3
KIT BASIN OR (CUSTOM PROCEDURE TRAY) IMPLANT
KIT TURNOVER KIT B (KITS) ×3 IMPLANT
NS IRRIG 1000ML POUR BTL (IV SOLUTION) ×2 IMPLANT
PAD ARMBOARD 7.5X6 YLW CONV (MISCELLANEOUS) ×6 IMPLANT
PENCIL BUTTON HOLSTER BLD 10FT (ELECTRODE) ×3 IMPLANT
SPECIMEN JAR SMALL (MISCELLANEOUS) IMPLANT
STAPLER VISISTAT 35W (STAPLE) IMPLANT
SUT ETHIBOND 2 0 FS (SUTURE) IMPLANT
SUT SILK 3 0 REEL (SUTURE) IMPLANT
SUT VIC AB 3-0 SH 27 (SUTURE) ×3
SUT VIC AB 3-0 SH 27X BRD (SUTURE) IMPLANT
SUT VIC AB 4-0 RB1 27 (SUTURE)
SUT VIC AB 4-0 RB1 27X BRD (SUTURE) IMPLANT
TOWEL OR 17X24 6PK STRL BLUE (TOWEL DISPOSABLE) ×3 IMPLANT
TOWEL OR 17X26 10 PK STRL BLUE (TOWEL DISPOSABLE) ×3 IMPLANT
TRAY ENT MC OR (CUSTOM PROCEDURE TRAY) ×2 IMPLANT
TRAY FOLEY MTR SLVR 14FR STAT (SET/KITS/TRAYS/PACK) IMPLANT
WATER STERILE IRR 1000ML POUR (IV SOLUTION) IMPLANT

## 2018-09-25 NOTE — Transfer of Care (Signed)
Immediate Anesthesia Transfer of Care Note  Patient: Taylor Fernandez  Procedure(s) Performed: REMOVAL OF SALIVARY GLAND (N/A Mouth)  Patient Location: PACU  Anesthesia Type:General  Level of Consciousness: drowsy and patient cooperative  Airway & Oxygen Therapy: Patient Spontanous Breathing and Patient connected to nasal cannula oxygen  Post-op Assessment: Report given to RN  Post vital signs: Reviewed and stable  Last Vitals:  Vitals Value Taken Time  BP 161/92 09/25/2018 11:52 AM  Temp    Pulse 122 09/25/2018 11:54 AM  Resp 12 09/25/2018 11:54 AM  SpO2 100 % 09/25/2018 11:54 AM  Vitals shown include unvalidated device data.  Last Pain:  Vitals:   09/25/18 0802  TempSrc: Oral  PainSc: 0-No pain      Patients Stated Pain Goal: 0 (02/54/86 2824)  Complications: No apparent anesthesia complications

## 2018-09-25 NOTE — Anesthesia Postprocedure Evaluation (Signed)
Anesthesia Post Note  Patient: Aline August  Procedure(s) Performed: REMOVAL OF SALIVARY GLAND (N/A Mouth)     Patient location during evaluation: PACU Anesthesia Type: General Level of consciousness: awake and alert Pain management: pain level controlled Vital Signs Assessment: post-procedure vital signs reviewed and stable Respiratory status: spontaneous breathing, nonlabored ventilation and respiratory function stable Cardiovascular status: blood pressure returned to baseline and stable Postop Assessment: no apparent nausea or vomiting Anesthetic complications: no    Last Vitals:  Vitals:   09/25/18 1215 09/25/18 1237  BP:  (!) 145/82  Pulse: (!) 113 (!) 107  Resp: 15 11  Temp:  36.7 C  SpO2: 94% 97%    Last Pain:  Vitals:   09/25/18 1215  TempSrc:   PainSc: 0-No pain                 Parrish Bonn,W. EDMOND

## 2018-09-25 NOTE — Op Note (Signed)
[name], Taylor Fernandez MEDICAL RECORD [id number] ACCOUNT [id number] DATE OF BIRTH:1995/12/06 FACILITY: [facility] LOCATION: [facility] PHYSICIAN:Elloise Roark M. Bellamie Turney, DDS  OPERATIVE REPORT  DATE OF PROCEDURE:  09/25/2018  PREOPERATIVE DIAGNOSIS:  Ranula of mandible.  POSTOPERATIVE DIAGNOSIS:  Epidermoid cyst of mandible.  PROCEDURE:  Removal of mandibular cyst.  SURGEON:  Diona Browner, DDS  ANESTHESIA:  General nasal intubation, Fitzgerald attending.  DESCRIPTION OF PROCEDURE:  The patient was taken to the operating room and placed on the table in supine position.  General anesthesia was administered intravenously and a nasal endotracheal tube was placed and secured.  The eyes were protected and the  patient was draped for surgery.  A timeout was performed.  The posterior pharynx was suctioned and a throat pack was placed, 2% lidocaine 1:100,000 epinephrine was infiltrated in the inferior alveolar block on the right and left sides and then the tongue  was retracted superiorly and a #15 blade was used to make an incision transversely in the floor of the mouth overlying the cyst of the mandible.  The dissection was carried down to the outer border of the cyst, which was encapsulated and this was used  as a plane of dissection anteriorly, posteriorly and laterally.  The cyst was dissected with curved Metzenbaum scissors, periosteal elevator and Selden elevator.  An Allis forceps was used to grasp the cyst in aid of removal.  At some point during the  surgery, the cyst burst and white keratin appearing pasty substance was expressed from the area.  Then, the cyst was further decompressed to allow for ease of removal.  Eventually, the cyst was removed, although it still retained a large portion of  keratin material.  After the cyst was removed it was measured at approximately 8 cm x 4 cm x 4 cm.  Then, the cavity where the cyst was, was irrigated with normal saline and then a Penrose drain quarter  inch was placed into the depth of the cavity and  sutured to the mucosa with a 3-0 silk.  Then, the incision was closed with 3-0 Vicryl.  The oral cavity was then irrigated and suctioned and a throat pack was removed.  The patient was left in care of anesthesia for extubation and transport to recovery  room with plans for discharge home through day surgery.  ESTIMATED BLOOD LOSS:  Minimum.  COMPLICATIONS:  None.  SPECIMENS:  Cyst of mandible.    PRELIMINARY DIAGNOSIS:  Epidermoid cyst.  COUNTS:  Correct.  TN/NUANCE  D:09/25/2018 T:09/25/2018 JOB:[id number]

## 2018-09-25 NOTE — H&P (Signed)
H&P documentation  -History and Physical Reviewed  -Patient has been re-examined  -No change in the plan of care  Taylor Fernandez  

## 2018-09-25 NOTE — Op Note (Signed)
09/25/2018  11:29 AM  PATIENT:  Aline August  22 y.o. male  PRE-OPERATIVE DIAGNOSIS: Ranula of mandible  POST-OPERATIVE DIAGNOSIS:  Epidermoid Cyst  PROCEDURE:  Procedure(s): removal mandibular cyst   SURGEON:  Surgeon(s): Diona Browner, DDS  ANESTHESIA:   local and general  EBL:  minimal  DRAINS: none   SPECIMEN:  Cyst of mandible, preliminary diagnosis epidermoid cyst  COUNTS:  YES  PLAN OF CARE: Discharge to home after PACU  PATIENT DISPOSITION:  PACU - hemodynamically stable.   PROCEDURE DETAILS: Dictation # [id number]  Gae Bon, DMD 09/25/2018 11:29 AM

## 2018-09-25 NOTE — Anesthesia Procedure Notes (Signed)
Procedure Name: Intubation Date/Time: 09/25/2018 10:21 AM Performed by: Barrington Ellison, CRNA Pre-anesthesia Checklist: Patient identified, Emergency Drugs available, Suction available and Patient being monitored Patient Re-evaluated:Patient Re-evaluated prior to induction Oxygen Delivery Method: Circle system utilized Preoxygenation: Pre-oxygenation with 100% oxygen Induction Type: IV induction Ventilation: Mask ventilation without difficulty Laryngoscope Size: Glidescope and 4 Grade View: Grade I Nasal Tubes: Nasal prep performed, Nasal Rae and Right Tube size: 7.5 mm Number of attempts: 1 Placement Confirmation: ETT inserted through vocal cords under direct vision,  positive ETCO2 and breath sounds checked- equal and bilateral Secured at: 27 cm Tube secured with: Tape Dental Injury: Teeth and Oropharynx as per pre-operative assessment  Comments: Glidescope used for nasal intubation.

## 2018-09-26 ENCOUNTER — Encounter (HOSPITAL_COMMUNITY): Payer: Self-pay | Admitting: Oral Surgery

## 2018-10-08 NOTE — Progress Notes (Signed)
  Subjective:  Patient ID: Taylor Fernandez, male    DOB: 1996-02-02,  MRN: [id number]  Chief Complaint  Patient presents with  . Callouses    2 month follow up - right great toe thick callus. Patient has just recently gotten new shoes and new braces    22 y.o. male presents with the above complaint.  Doing well reports recurrence of the callus however denies new concerns  Review of Systems: Negative except as noted in the HPI. Denies N/V/F/Ch.  Past Medical History:  Diagnosis Date  . Cerebral palsy ([place])   . Developmental delay   . Ranula of floor of mouth   . Wheelchair bound    Can not stand     Current Outpatient Medications:  .  amoxicillin (AMOXIL) 500 MG capsule, Take 1 capsule (500 mg total) by mouth 3 (three) times daily., Disp: 30 capsule, Rfl: 1 .  oxyCODONE-acetaminophen (PERCOCET) 5-325 MG tablet, Take 1 tablet by mouth every 4 (four) hours as needed., Disp: 30 tablet, Rfl: 0  Social History   Tobacco Use  Smoking Status Never Smoker  Smokeless Tobacco Never Used    No Known Allergies Objective:  There were no vitals filed for this visit. There is no height or weight on file to calculate BMI. Constitutional Well developed. Well nourished.  Vascular Dorsalis pedis pulses palpable bilaterally. Posterior tibial pulses palpable bilaterally. Capillary refill normal to all digits.  No cyanosis or clubbing noted. Pedal hair growth normal.  Neurologic Normal speech. Oriented to person, place, and time. Epicritic sensation to light touch grossly present bilaterally.  Dermatologic Nails well groomed and normal in appearance. Ingrowing right hallux nail medial border Pre-ulcerative lesion left hallux IPJ plantarly No skin lesions.  Orthopedic: Normal joint ROM without pain or crepitus bilaterally. No visible deformities. No bony tenderness.   Radiographs: None Assessment:   1. Porokeratosis   2. Neuropathy    Plan:  Patient was evaluated and treated  and all questions answered.  Pre-ulcerative callus left hallux -Debridement with 312 blade without incident continue padding  Return in about 9 weeks (around 11/22/2018).

## 2018-11-22 ENCOUNTER — Ambulatory Visit: Payer: Medicaid Other | Admitting: Podiatry

## 2018-11-22 DIAGNOSIS — Q828 Other specified congenital malformations of skin: Secondary | ICD-10-CM

## 2018-11-22 DIAGNOSIS — M898X9 Other specified disorders of bone, unspecified site: Secondary | ICD-10-CM

## 2018-11-22 DIAGNOSIS — M216X1 Other acquired deformities of right foot: Secondary | ICD-10-CM

## 2018-11-22 DIAGNOSIS — G629 Polyneuropathy, unspecified: Secondary | ICD-10-CM

## 2018-12-07 ENCOUNTER — Telehealth: Payer: Self-pay | Admitting: Rehabilitation

## 2018-12-07 NOTE — Telephone Encounter (Signed)
Entered in error

## 2018-12-08 NOTE — Progress Notes (Signed)
  Subjective:  Patient ID: Taylor Fernandez, male    DOB: 04-19-1996,  MRN: [id number]  Chief Complaint  Patient presents with  . Foot Problem    Right 1st toe is swollen 1 1/2 week duration  . Callouses    Right 1st toe callous, pt has been applying cream at directed.    23 y.o. male presents with the above complaint.  Denies new issues history as above  Review of Systems: Negative except as noted in the HPI. Denies N/V/F/Ch.  Past Medical History:  Diagnosis Date  . Cerebral palsy ([place])   . Developmental delay   . Ranula of floor of mouth   . Wheelchair bound    Can not stand     Current Outpatient Medications:  .  amoxicillin (AMOXIL) 500 MG capsule, Take 1 capsule (500 mg total) by mouth 3 (three) times daily. (Patient not taking: Reported on 11/22/2018), Disp: 30 capsule, Rfl: 1 .  oxyCODONE-acetaminophen (PERCOCET) 5-325 MG tablet, Take 1 tablet by mouth every 4 (four) hours as needed. (Patient not taking: Reported on 11/22/2018), Disp: 30 tablet, Rfl: 0  Social History   Tobacco Use  Smoking Status Never Smoker  Smokeless Tobacco Never Used    No Known Allergies Objective:  There were no vitals filed for this visit. There is no height or weight on file to calculate BMI. Constitutional Well developed. Well nourished.  Vascular Dorsalis pedis pulses palpable bilaterally. Posterior tibial pulses palpable bilaterally. Capillary refill normal to all digits.  No cyanosis or clubbing noted. Pedal hair growth normal.  Neurologic Normal speech. Oriented to person, place, and time. Epicritic sensation to light touch grossly present bilaterally.  Dermatologic Nails well groomed and normal in appearance. Ingrowing right hallux nail medial border Pre-ulcerative lesion left hallux IPJ plantarly No skin lesions.  Orthopedic: Normal joint ROM without pain or crepitus bilaterally. No visible deformities. No bony tenderness.   Radiographs: None Assessment:   1.  Porokeratosis   2. Neuropathy   3. Bony exostosis    Plan:  Patient was evaluated and treated and all questions answered.  Pre-ulcerative callus left hallux -Debridement of callus with 312 blade without incident  Hallux exostosis -Patient possible surgical correction of the underlying osseous deformity of injury ulceration.  We will discuss more next visit     Return in about 9 weeks (around 01/24/2019).

## 2018-12-20 ENCOUNTER — Ambulatory Visit: Payer: Medicaid Other | Admitting: Physical Therapy

## 2018-12-24 ENCOUNTER — Ambulatory Visit: Payer: Medicaid Other | Attending: Family Medicine | Admitting: Rehabilitation

## 2018-12-28 ENCOUNTER — Ambulatory Visit: Payer: Medicaid Other | Admitting: Physical Therapy

## 2019-01-24 ENCOUNTER — Encounter: Payer: Self-pay | Admitting: Podiatry

## 2019-01-24 ENCOUNTER — Other Ambulatory Visit: Payer: Self-pay

## 2019-01-24 ENCOUNTER — Ambulatory Visit: Payer: Medicaid Other | Admitting: Podiatry

## 2019-01-24 VITALS — Temp 97.5°F

## 2019-01-24 DIAGNOSIS — M21611 Bunion of right foot: Secondary | ICD-10-CM | POA: Diagnosis not present

## 2019-01-24 DIAGNOSIS — Q828 Other specified congenital malformations of skin: Secondary | ICD-10-CM

## 2019-01-24 DIAGNOSIS — M898X9 Other specified disorders of bone, unspecified site: Secondary | ICD-10-CM

## 2019-01-24 DIAGNOSIS — G629 Polyneuropathy, unspecified: Secondary | ICD-10-CM | POA: Diagnosis not present

## 2019-01-24 DIAGNOSIS — L84 Corns and callosities: Secondary | ICD-10-CM

## 2019-01-24 NOTE — Progress Notes (Signed)
  Subjective:  Patient ID: Taylor Fernandez, male    DOB: 19-Nov-1995,  MRN: [id number]  Chief Complaint  Patient presents with  . Callouses    Rt foot 1st toe, no other complaints    23 y.o. male presents with the above complaint.  States the toe has been hurting more recently as he attempts to walk.  Review of Systems: Negative except as noted in the HPI. Denies N/V/F/Ch.  Past Medical History:  Diagnosis Date  . Cerebral palsy ([place])   . Developmental delay   . Ranula of floor of mouth   . Wheelchair bound    Can not stand     Current Outpatient Medications:  .  amoxicillin (AMOXIL) 500 MG capsule, Take 1 capsule (500 mg total) by mouth 3 (three) times daily. (Patient not taking: Reported on 11/22/2018), Disp: 30 capsule, Rfl: 1 .  oxyCODONE-acetaminophen (PERCOCET) 5-325 MG tablet, Take 1 tablet by mouth every 4 (four) hours as needed. (Patient not taking: Reported on 11/22/2018), Disp: 30 tablet, Rfl: 0  Social History   Tobacco Use  Smoking Status Never Smoker  Smokeless Tobacco Never Used    No Known Allergies Objective:   Vitals:   01/24/19 0908  Temp: (!) 97.5 F (36.4 C)   There is no height or weight on file to calculate BMI. Constitutional Well developed. Well nourished.  Vascular Dorsalis pedis pulses palpable bilaterally. Posterior tibial pulses palpable bilaterally. Capillary refill normal to all digits.  No cyanosis or clubbing noted. Pedal hair growth normal.  Neurologic Normal speech. Oriented to person, place, and time. Epicritic sensation to light touch grossly present bilaterally.  Dermatologic Nails well groomed and normal in appearance. Ingrowing right hallux nail medial border Pre-ulcerative lesion left hallux IPJ plantarly No skin lesions.  Orthopedic: Normal joint ROM without pain or crepitus bilaterally. No visible deformities. Exostosis left medial IPJ.   Radiographs: None Assessment:   1. Callus of foot   2. Neuropathy   3.  Bony exostosis    Plan:  Patient was evaluated and treated and all questions answered.  Pre-ulcerative callus left hallux -Debridement as below.  Procedure: Paring of Lesion Rationale: painful hyperkeratotic lesion Type of Debridement: manual, sharp debridement. Instrumentation: 312 blade Number of Lesions: 1   Hallux exostosis -Again discussed surgical intervention in the future. Current hold on elective surgery due to pandemic and shortage of supplies.    Return in about 9 weeks (around 03/28/2019) for Callus debridement (neuropathy) .

## 2019-01-25 NOTE — Addendum Note (Signed)
Addended by: Hardie Pulley on: 01/25/2019 09:59 AM   Modules accepted: Level of Service

## 2019-01-25 NOTE — Addendum Note (Signed)
Addended by: Hardie Pulley on: 01/25/2019 10:00 AM   Modules accepted: Level of Service

## 2019-03-28 ENCOUNTER — Other Ambulatory Visit: Payer: Self-pay

## 2019-03-28 ENCOUNTER — Ambulatory Visit: Payer: Medicaid Other | Admitting: Podiatry

## 2019-03-28 VITALS — Temp 97.9°F

## 2019-03-28 DIAGNOSIS — L84 Corns and callosities: Secondary | ICD-10-CM | POA: Diagnosis not present

## 2019-03-28 NOTE — Progress Notes (Signed)
  Subjective:  Patient ID: Taylor Fernandez, male    DOB: 1996-10-07,  MRN: [id number]  Chief Complaint  Patient presents with  . Callouses    Right 1st digit callous follow up. Pt has no issues/concerns.    23 y.o. male presents with the above complaint.  Doing better callus isn't bothering as much as usually.  Review of Systems: Negative except as noted in the HPI. Denies N/V/F/Ch.  Past Medical History:  Diagnosis Date  . Cerebral palsy ([place])   . Developmental delay   . Ranula of floor of mouth   . Wheelchair bound    Can not stand     Current Outpatient Medications:  .  amoxicillin (AMOXIL) 500 MG capsule, Take 1 capsule (500 mg total) by mouth 3 (three) times daily. (Patient not taking: Reported on 11/22/2018), Disp: 30 capsule, Rfl: 1 .  oxyCODONE-acetaminophen (PERCOCET) 5-325 MG tablet, Take 1 tablet by mouth every 4 (four) hours as needed. (Patient not taking: Reported on 11/22/2018), Disp: 30 tablet, Rfl: 0  Social History   Tobacco Use  Smoking Status Never Smoker  Smokeless Tobacco Never Used    No Known Allergies Objective:   Vitals:   03/28/19 0845  Temp: 97.9 F (36.6 C)   There is no height or weight on file to calculate BMI. Constitutional Well developed. Well nourished.  Vascular Dorsalis pedis pulses palpable bilaterally. Posterior tibial pulses palpable bilaterally. Capillary refill normal to all digits.  No cyanosis or clubbing noted. Pedal hair growth normal.  Neurologic Normal speech. Oriented to person, place, and time. Epicritic sensation to light touch grossly present bilaterally.  Dermatologic Nails well groomed and normal in appearance. Ingrowing right hallux nail medial border Pre-ulcerative lesion left hallux IPJ plantarly No skin lesions.  Orthopedic: Normal joint ROM without pain or crepitus bilaterally. No visible deformities. Exostosis left medial IPJ.   Radiographs: None Assessment:   1. Callus of foot    Plan:   Patient was evaluated and treated and all questions answered.  Pre-ulcerative callus left hallux -Debridement as below  Procedure: Callus Debridement Rationale: pain. Type of Debridement: sharp Instrumentation: 312 blade Number of Calluses: 1  Hallux exostosis -Discussed at this point I think he would benefit most from routine care and not surgical intervention. If the callus proves unable to be controlled with routine care we could consider surgical intervention. Educated on self care mother to call if callus worsens or falis to improve..    No follow-ups on file.

## 2019-04-03 ENCOUNTER — Encounter: Payer: Self-pay | Admitting: Physical Therapy

## 2019-04-03 ENCOUNTER — Ambulatory Visit: Payer: Medicaid Other | Attending: Family Medicine | Admitting: Physical Therapy

## 2019-04-03 ENCOUNTER — Other Ambulatory Visit: Payer: Self-pay

## 2019-04-03 DIAGNOSIS — R2689 Other abnormalities of gait and mobility: Secondary | ICD-10-CM | POA: Insufficient documentation

## 2019-04-03 DIAGNOSIS — R2681 Unsteadiness on feet: Secondary | ICD-10-CM | POA: Insufficient documentation

## 2019-04-03 DIAGNOSIS — R278 Other lack of coordination: Secondary | ICD-10-CM | POA: Insufficient documentation

## 2019-04-03 DIAGNOSIS — M6281 Muscle weakness (generalized): Secondary | ICD-10-CM | POA: Insufficient documentation

## 2019-04-03 DIAGNOSIS — G8113 Spastic hemiplegia affecting right nondominant side: Secondary | ICD-10-CM | POA: Insufficient documentation

## 2019-04-03 DIAGNOSIS — G8112 Spastic hemiplegia affecting left dominant side: Secondary | ICD-10-CM

## 2019-04-03 NOTE — Therapy (Signed)
[facility] [address] [place], [place], [address] Phone: [phone]   Fax:  [phone]  Physical Therapy Encounter (arrive, no charge)  Patient Details  Name: Taylor Fernandez MRN: [id number] Date of Birth: May 09, 1996 Referring Provider (PT): Dineen Kid, MD  CLINIC OPERATION CHANGES: Outpatient [facility] Rehab is open at lower capacity following universal masking, social distancing, and patient screening.  The patient's COVID risk of complications score is 1).   Encounter Date: 04/03/2019  PT End of Session - 04/03/19 1522    Visit Number  44   no charge for 04/03/2019   Number of Visits  25    Authorization Type  Medicaid - will request 6-8 more visits for aquatic therapy    PT Start Time  0700    PT Stop Time  7412    PT Time Calculation (min)  55 min       Past Medical History:  Diagnosis Date  . Cerebral palsy ([place])   . Developmental delay   . Ranula of floor of mouth   . Wheelchair bound    Can not stand     Past Surgical History:  Procedure Laterality Date  . baclofen pump removal    . botox injections    . EYE MUSCLE SURGERY     23 yrs old and 18-10 yrs old  . KNEE SURGERY     Tendons extended on both legs  . SALIVARY STONE REMOVAL N/A 09/25/2018   Procedure: REMOVAL OF SALIVARY GLAND;  Surgeon: Diona Browner, DDS;  Location: [facility];  Service: Oral Surgery;  Laterality: N/A;  . STRABISMUS SURGERY Bilateral 04/03/2015   Procedure: REPAIR STRABISMUS PEDIATRIC;  Surgeon: Everitt Amber, MD;  Location: [facility];  Service: Ophthalmology;  Laterality: Bilateral;    There were no vitals filed for this visit.  Subjective Assessment - 04/03/19 1519    Subjective  Mother and patient here to pick up posterior rolling walker delivered for patient from [employer].  Mother is very interested in patient returning to aquatic therapy.    Patient is accompained by:  Family member    Pertinent History   CP (Goes to [employer] 5 days per week 8-11am)    Patient Stated Goals  "To do more for myself."    Currently in Pain?  No/denies          PT Education - 04/03/19 1520    Education Details  how to set up and manage posterior RW parts, how to set up pt in posterior RW    Person(s) Educated  Patient;Parent(s)    Methods  Explanation;Demonstration    Comprehension  Need further instruction       PT Short Term Goals - 10/22/18 0859      PT SHORT TERM GOAL #1   Title  =LTG's         PT Long Term Goals - 04/03/19 1533      PT LONG TERM GOAL #1   Title  Pt will perform stand pivot transfer w/ LRAD at S level to the L and min A to the R in order to increase independence at home.    Baseline  min-mod A each direction    Time  1    Period  Months    Status  Not Met      PT LONG TERM GOAL #2   Title  Pt will ambulate x 68' w/ LRAD at min/guard level (in new posterior walker) in order  to indicate improved functional independence in home.     Baseline  25' with mod A    Time  1    Period  Months    Status  Partially Met      PT LONG TERM GOAL #3   Title  Pt will tolerate 10 mins of standing with BUE support (without use of stander) in order to improve independence with ADLs.     Baseline  stood in posterior RW >10 minutes while performing set up preparing for ambulation    Time  1    Period  Months    Status  Achieved      PT LONG TERM GOAL #4   Title  Pt/mother will be independent with final HEP in order to indicate improved functional mobility and decreased fall risk.      Baseline  unable to perform aquatic HEP due to COVID restrictions    Time  1    Period  Months    Status  Not Met      PT LONG TERM GOAL #5   Title  Pt will transfer to posterior walker with min A in order to begin ambulation in home safely.     Baseline  unable to assess due to not having his walker    Time  1    Period  Months    Status  Achieved      New goals for recertification: PT Long Term  Goals - 04/03/19 1542      PT LONG TERM GOAL #1   Title  Pt will demonstrate ability to transfers stand pivot from w/c with mother with minimal assistance    Baseline  min-mod A each direction    Time  6    Period  Weeks    Status  Revised    Target Date  05/18/19      PT LONG TERM GOAL #2   Title  Patient's mother will report that pt is ambulating up to 59-75' at home in posterior walker with minimal assistance    Baseline  25' with mod A    Time  6    Period  Weeks    Status  Revised    Target Date  05/18/19      PT LONG TERM GOAL #4   Title  Pt/mother will be independent with final aquatic HEP in order to indicate improved functional mobility and decreased fall risk.    Baseline  unable to perform aquatic HEP due to COVID restrictions    Time  6    Period  Weeks    Status  Not Met    Target Date  05/18/19            Plan - 04/03/19 1524    Clinical Impression Statement  Pt returns to pick up new posterior rolling walker ordered by primary PT.  Pt was unable to attend therapy earlier in this year or begin his community aquatic program at the [facility] due to [place] quarantine restrictions.  Mother is interested in pt returning to aquatic therapy.  Posterior rolling walker components were adjusted for optimal support and joint positioning to assist with more upright trunk and gaze, pushing through each UE and ambulating with decreased physical assistance.  Pt has met 2/5 LTG, partially met ambulation goal with posterior RW but did not meet transfer and HEP goals.  Now that quarantine restrictions have been eased pt and mother have requested to initiate aquatic therapy again  in order to transition to a community aquatic wellness program at the [facility].  Pt and mother to continue ambulating with posterior RW at home now that education is complete.  Recommending 6 more visits for aquatic therapy to continue to address impairments in strength, ROM, postural control, endurance, coordination  and balance to improve functional mobility independence on land and decrease burden of care.    Rehab Potential  Good    Clinical Impairments Affecting Rehab Potential  severity of deficits    PT Frequency  1x / week    PT Duration  6 weeks    PT Treatment/Interventions  ADLs/Self Care Home Management;Aquatic Therapy;DME Instruction;Gait training;Stair training;Functional mobility training;Therapeutic activities;Therapeutic exercise;Balance training;Neuromuscular re-education;Patient/family education;Orthotic Fit/Training;Passive range of motion    PT Next Visit Plan  Restart aquatic therapy    PT Home Exercise Plan  aquatic HEP to be completed - instruction completed on 08-15-18 (need to give pics to pt and mother)     Consulted and Agree with Plan of Care  Patient;Family member/caregiver    Family Member Consulted  mother       Patient will benefit from skilled therapeutic intervention in order to improve the following deficits and impairments:  Abnormal gait, Decreased balance, Decreased coordination, Decreased endurance, Decreased knowledge of use of DME, Decreased mobility, Decreased range of motion, Decreased strength, Impaired perceived functional ability, Impaired flexibility, Impaired tone, Impaired UE functional use, Postural dysfunction, Difficulty walking, Increased muscle spasms  Visit Diagnosis: 1. Other abnormalities of gait and mobility   2. Muscle weakness (generalized)   3. Unsteadiness on feet   4. Spastic hemiplegia affecting left dominant side, unspecified etiology ([place])   5. Spastic hemiplegia affecting right nondominant side, unspecified etiology ([place])   6. Other lack of coordination        Problem List Patient Active Problem List   Diagnosis Date Noted  . Reactive airway disease 11/09/2017  . 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 Congenital quadriplegia ([place]) 01/05/2012    Rico Junker, PT, DPT 04/03/19    3:42 PM    [facility] [address] [place], [place], [address] Phone: [phone]   Fax:  [phone]  Name: Taylor Fernandez MRN: [id number] Date of Birth: [birth year]

## 2019-05-27 ENCOUNTER — Other Ambulatory Visit: Payer: Self-pay

## 2019-05-27 ENCOUNTER — Ambulatory Visit: Payer: Medicaid Other | Attending: Family Medicine | Admitting: Physical Therapy

## 2019-05-27 DIAGNOSIS — M6281 Muscle weakness (generalized): Secondary | ICD-10-CM | POA: Diagnosis present

## 2019-05-27 DIAGNOSIS — R2689 Other abnormalities of gait and mobility: Secondary | ICD-10-CM | POA: Diagnosis present

## 2019-05-27 DIAGNOSIS — R2681 Unsteadiness on feet: Secondary | ICD-10-CM | POA: Insufficient documentation

## 2019-05-27 DIAGNOSIS — R278 Other lack of coordination: Secondary | ICD-10-CM | POA: Diagnosis present

## 2019-05-28 ENCOUNTER — Encounter: Payer: Self-pay | Admitting: Physical Therapy

## 2019-05-28 NOTE — Therapy (Signed)
[facility] [address] [place], [place], [address] Phone: [phone]   Fax:  [phone]  Physical Therapy Treatment  Patient Details  Name: Taylor Fernandez MRN: [id number] Date of Birth: 05/01/1996 Referring Provider (PT): Dineen Kid, MD   Encounter Date: 05/27/2019  PT End of Session - 05/28/19 2205    Visit Number  1   no charge for 04/03/2019   Number of Visits  6    Date for PT Re-Evaluation  07/07/19    Authorization Type  Medicaid - will request 6-8 more visits for aquatic therapy    Authorization Time Period  05-27-19 - 07-07-19    Authorization - Visit Number  1    Authorization - Number of Visits  6    PT Start Time  1500    PT Stop Time  1545    PT Time Calculation (min)  45 min    Equipment Utilized During Treatment  --   water walker   Activity Tolerance  Patient tolerated treatment well    Behavior During Therapy  [facility] for tasks assessed/performed       Past Medical History:  Diagnosis Date  . Cerebral palsy ([place])   . Developmental delay   . Ranula of floor of mouth   . Wheelchair bound    Can not stand     Past Surgical History:  Procedure Laterality Date  . baclofen pump removal    . botox injections    . EYE MUSCLE SURGERY     23 yrs old and 52-10 yrs old  . KNEE SURGERY     Tendons extended on both legs  . SALIVARY STONE REMOVAL N/A 09/25/2018   Procedure: REMOVAL OF SALIVARY GLAND;  Surgeon: Diona Browner, DDS;  Location: [facility];  Service: Oral Surgery;  Laterality: N/A;  . STRABISMUS SURGERY Bilateral 04/03/2015   Procedure: REPAIR STRABISMUS PEDIATRIC;  Surgeon: Everitt Amber, MD;  Location: [facility];  Service: Ophthalmology;  Laterality: Bilateral;    There were no vitals filed for this visit.  Subjective Assessment - 05/28/19 2204    Subjective  Mother accompanying pt to aquatic therapy - plans to get in pool and assist with exercises for him    Patient is  accompained by:  Family member    Pertinent History  CP (Goes to [facility] 5 days per week 8-11am)    Patient Stated Goals  "To do more for myself."    Currently in Pain?  No/denies                                 PT Short Term Goals - 05/28/19 2207      PT SHORT TERM GOAL #1   Title  =LTG's         PT Long Term Goals - 05/28/19 2207      PT LONG TERM GOAL #1   Title  Pt will demonstrate ability to transfers stand pivot from w/c with mother with minimal assistance    Baseline  min-mod A each direction    Time  6    Period  Weeks    Status  Revised      PT LONG TERM GOAL #2   Title  Patient's mother will report that pt is ambulating up to 50-75' at home in posterior walker with minimal assistance    Baseline  25' with mod A  Time  6    Period  Weeks    Status  Revised      PT LONG TERM GOAL #4   Title  Pt/mother will be independent with final aquatic HEP in order to indicate improved functional mobility and decreased fall risk.    Baseline  unable to perform aquatic HEP due to COVID restrictions    Time  6    Period  Weeks    Status  Not Met            Plan - 05/28/19 2207    Rehab Potential  Good    Clinical Impairments Affecting Rehab Potential  severity of deficits    PT Frequency  1x / week    PT Duration  6 weeks    PT Treatment/Interventions  ADLs/Self Care Home Management;Aquatic Therapy;DME Instruction;Gait training;Stair training;Functional mobility training;Therapeutic activities;Therapeutic exercise;Balance training;Neuromuscular re-education;Patient/family education;Orthotic Fit/Training;Passive range of motion    PT Next Visit Plan  Restart aquatic therapy    PT Home Exercise Plan  aquatic HEP to be completed - instruction completed on 08-15-18 (need to give pics to pt and mother)     Consulted and Agree with Plan of Care  Patient;Family member/caregiver    Family Member Consulted  mother       Patient will benefit from  skilled therapeutic intervention in order to improve the following deficits and impairments:  Abnormal gait, Decreased balance, Decreased coordination, Decreased endurance, Decreased knowledge of use of DME, Decreased mobility, Decreased range of motion, Decreased strength, Impaired perceived functional ability, Impaired flexibility, Impaired tone, Impaired UE functional use, Postural dysfunction, Difficulty walking, Increased muscle spasms  Visit Diagnosis: 1. Other abnormalities of gait and mobility   2. Muscle weakness (generalized)   3. Unsteadiness on feet   4. Other lack of coordination        Problem List Patient Active Problem List   Diagnosis Date Noted  . Reactive airway disease 11/09/2017  . 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 Congenital quadriplegia ([place]) 01/05/2012    Taylor Fernandez, [place], PT, [place] 05/28/2019, 10:09 PM  [facility] [address] [place], [place], [address] Phone: [phone]   Fax:  [phone]  Name: Taylor Fernandez MRN: [id number] Date of Birth: 12-10-1995

## 2019-05-31 ENCOUNTER — Ambulatory Visit: Payer: Medicaid Other | Admitting: Podiatry

## 2019-06-03 ENCOUNTER — Ambulatory Visit: Payer: Medicaid Other | Admitting: Physical Therapy

## 2019-06-03 ENCOUNTER — Other Ambulatory Visit: Payer: Self-pay

## 2019-06-03 DIAGNOSIS — R2689 Other abnormalities of gait and mobility: Secondary | ICD-10-CM | POA: Diagnosis not present

## 2019-06-03 DIAGNOSIS — R2681 Unsteadiness on feet: Secondary | ICD-10-CM

## 2019-06-03 DIAGNOSIS — M6281 Muscle weakness (generalized): Secondary | ICD-10-CM

## 2019-06-04 NOTE — Therapy (Signed)
[facility] [address] [place], [place], [address] Phone: [phone]   Fax:  [phone]  Physical Therapy Treatment  Patient Details  Name: Taylor Fernandez MRN: [id number] Date of Birth: June 17, 1996 Referring Provider (PT): Dineen Kid, MD   Encounter Date: 06/03/2019  PT End of Session - 06/04/19 2207    Visit Number  2   no charge for 04/03/2019   Number of Visits  6    Date for PT Re-Evaluation  07/07/19    Authorization Type  Medicaid - will request 6-8 more visits for aquatic therapy    Authorization Time Period  05-27-19 - 07-07-19    Authorization - Visit Number  2    Authorization - Number of Visits  6    PT Start Time  1505    PT Stop Time  1545    PT Time Calculation (min)  40 min    Equipment Utilized During Treatment  Other (comment)   water walker and pool noodle   Activity Tolerance  Patient tolerated treatment well    Behavior During Therapy  [facility] for tasks assessed/performed       Past Medical History:  Diagnosis Date  . Cerebral palsy ([place])   . Developmental delay   . Ranula of floor of mouth   . Wheelchair bound    Can not stand     Past Surgical History:  Procedure Laterality Date  . baclofen pump removal    . botox injections    . EYE MUSCLE SURGERY     23 yrs old and 68-10 yrs old  . KNEE SURGERY     Tendons extended on both legs  . SALIVARY STONE REMOVAL N/A 09/25/2018   Procedure: REMOVAL OF SALIVARY GLAND;  Surgeon: Diona Browner, DDS;  Location: [facility];  Service: Oral Surgery;  Laterality: N/A;  . STRABISMUS SURGERY Bilateral 04/03/2015   Procedure: REPAIR STRABISMUS PEDIATRIC;  Surgeon: Everitt Amber, MD;  Location: [facility];  Service: Ophthalmology;  Laterality: Bilateral;    There were no vitals filed for this visit.  Subjective Assessment - 06/04/19 2206    Subjective  pt presents to [facility] for aquatic therapy - accompanied by his father    Patient is  accompained by:  Family member    Pertinent History  CP (Goes to [employer] 5 days per week 8-11am)    Patient Stated Goals  "To do more for myself."    Currently in Pain?  No/denies                                 PT Short Term Goals - 05/28/19 2207      PT SHORT TERM GOAL #1   Title  =LTG's         PT Long Term Goals - 06/04/19 2209      PT LONG TERM GOAL #1   Title  Pt will demonstrate ability to transfers stand pivot from w/c with mother with minimal assistance    Baseline  min-mod A each direction    Time  6    Period  Weeks    Status  Revised      PT LONG TERM GOAL #2   Title  Patient's mother will report that pt is ambulating up to 50-75' at home in posterior walker with minimal assistance    Baseline  25' with mod A    Time  6    Period  Weeks    Status  Revised      PT LONG TERM GOAL #4   Title  Pt/mother will be independent with final aquatic HEP in order to indicate improved functional mobility and decreased fall risk.    Baseline  unable to perform aquatic HEP due to COVID restrictions    Time  6    Period  Weeks    Status  Not Met            Plan - 06/04/19 2208    Rehab Potential  Good    Clinical Impairments Affecting Rehab Potential  severity of deficits    PT Frequency  1x / week    PT Duration  6 weeks    PT Treatment/Interventions  ADLs/Self Care Home Management;Aquatic Therapy;DME Instruction;Gait training;Stair training;Functional mobility training;Therapeutic activities;Therapeutic exercise;Balance training;Neuromuscular re-education;Patient/family education;Orthotic Fit/Training;Passive range of motion    PT Next Visit Plan  Restart aquatic therapy    PT Home Exercise Plan  aquatic HEP to be completed - instruction completed on 08-15-18 (need to give pics to pt and mother)     Consulted and Agree with Plan of Care  Patient;Family member/caregiver    Family Member Consulted  mother       Patient will benefit from  skilled therapeutic intervention in order to improve the following deficits and impairments:  Abnormal gait, Decreased balance, Decreased coordination, Decreased endurance, Decreased knowledge of use of DME, Decreased mobility, Decreased range of motion, Decreased strength, Impaired perceived functional ability, Impaired flexibility, Impaired tone, Impaired UE functional use, Postural dysfunction, Difficulty walking, Increased muscle spasms  Visit Diagnosis: Other abnormalities of gait and mobility  Muscle weakness (generalized)  Unsteadiness on feet     Problem List Patient Active Problem List   Diagnosis Date Noted  . Reactive airway disease 11/09/2017  . 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 Congenital quadriplegia ([place]) 01/05/2012    Shea Swalley, [place], PT, [facility] 06/04/2019, 10:10 PM  [facility] [address] [place], [place], [address] Phone: [phone]   Fax:  [phone]  Name: ANDRES VEST MRN: [id number] Date of Birth: 1996-06-16

## 2019-06-10 ENCOUNTER — Ambulatory Visit: Payer: Medicaid Other | Admitting: Physical Therapy

## 2019-06-10 ENCOUNTER — Encounter: Payer: Self-pay | Admitting: Physical Therapy

## 2019-06-10 ENCOUNTER — Other Ambulatory Visit: Payer: Self-pay

## 2019-06-10 DIAGNOSIS — M6281 Muscle weakness (generalized): Secondary | ICD-10-CM

## 2019-06-10 DIAGNOSIS — R2689 Other abnormalities of gait and mobility: Secondary | ICD-10-CM | POA: Diagnosis not present

## 2019-06-10 DIAGNOSIS — R2681 Unsteadiness on feet: Secondary | ICD-10-CM

## 2019-06-10 NOTE — Therapy (Signed)
[facility] [address] [place], [place], [address] Phone: [phone]   Fax:  [phone]  Physical Therapy Treatment  Patient Details  Name: Taylor Fernandez MRN: [id number] Date of Birth: [birth year] Referring Provider (PT): Dineen Kid, MD   Encounter Date: 06/10/2019  PT End of Session - 06/10/19 2111    Visit Number  3   no charge for 04/03/2019   Number of Visits  6    Date for PT Re-Evaluation  07/07/19    Authorization Type  Medicaid - will request 6-8 more visits for aquatic therapy    Authorization Time Period  05-27-19 - 07-07-19    Authorization - Visit Number  3    Authorization - Number of Visits  6    PT Start Time  1508    PT Stop Time  1551    PT Time Calculation (min)  43 min    Equipment Utilized During Treatment  Other (comment)   water walker and pool noodle   Activity Tolerance  Patient tolerated treatment well    Behavior During Therapy  [facility] for tasks assessed/performed       Past Medical History:  Diagnosis Date  . Cerebral palsy ([place])   . Developmental delay   . Ranula of floor of mouth   . Wheelchair bound    Can not stand     Past Surgical History:  Procedure Laterality Date  . baclofen pump removal    . botox injections    . EYE MUSCLE SURGERY     23 yrs old and 21-10 yrs old  . KNEE SURGERY     Tendons extended on both legs  . SALIVARY STONE REMOVAL N/A 09/25/2018   Procedure: REMOVAL OF SALIVARY GLAND;  Surgeon: Diona Browner, DDS;  Location: [place];  Service: Oral Surgery;  Laterality: N/A;  . STRABISMUS SURGERY Bilateral 04/03/2015   Procedure: REPAIR STRABISMUS PEDIATRIC;  Surgeon: Everitt Amber, MD;  Location: [place];  Service: Ophthalmology;  Laterality: Bilateral;    There were no vitals filed for this visit.  Subjective Assessment - 06/10/19 2110    Subjective  pt presents to [facility] for aquatic therapy - accompanied by his brother    Patient is  accompained by:  Family member    Pertinent History  CP (Goes to [employer] 5 days per week 8-11am)    Patient Stated Goals  "To do more for myself."    Currently in Pain?  No/denies                                 PT Short Term Goals - 05/28/19 2207      PT SHORT TERM GOAL #1   Title  =LTG's         PT Long Term Goals - 06/10/19 2113      PT LONG TERM GOAL #1   Title  Pt will demonstrate ability to transfers stand pivot from w/c with mother with minimal assistance    Baseline  min-mod A each direction    Time  6    Period  Weeks    Status  Revised      PT LONG TERM GOAL #2   Title  Patient's mother will report that pt is ambulating up to 50-75' at home in posterior walker with minimal assistance    Baseline  25' with mod A    Time  6    Period  Weeks    Status  Revised      PT LONG TERM GOAL #4   Title  Pt/mother will be independent with final aquatic HEP in order to indicate improved functional mobility and decreased fall risk.    Baseline  unable to perform aquatic HEP due to COVID restrictions    Time  6    Period  Weeks    Status  Not Met            Plan - 06/10/19 2112    Rehab Potential  Good    Clinical Impairments Affecting Rehab Potential  severity of deficits    PT Frequency  1x / week    PT Duration  6 weeks    PT Treatment/Interventions  ADLs/Self Care Home Management;Aquatic Therapy;DME Instruction;Gait training;Stair training;Functional mobility training;Therapeutic activities;Therapeutic exercise;Balance training;Neuromuscular re-education;Patient/family education;Orthotic Fit/Training;Passive range of motion    PT Next Visit Plan  Restart aquatic therapy    PT Home Exercise Plan  aquatic HEP to be completed - instruction completed on 08-15-18 (need to give pics to pt and mother)     Consulted and Agree with Plan of Care  Patient;Family member/caregiver    Family Member Consulted  mother       Patient will benefit from  skilled therapeutic intervention in order to improve the following deficits and impairments:  Abnormal gait, Decreased balance, Decreased coordination, Decreased endurance, Decreased knowledge of use of DME, Decreased mobility, Decreased range of motion, Decreased strength, Impaired perceived functional ability, Impaired flexibility, Impaired tone, Impaired UE functional use, Postural dysfunction, Difficulty walking, Increased muscle spasms  Visit Diagnosis: Other abnormalities of gait and mobility  Muscle weakness (generalized)  Unsteadiness on feet     Problem List Patient Active Problem List   Diagnosis Date Noted  . Reactive airway disease 11/09/2017  . 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 Congenital quadriplegia ([place]) 01/05/2012    Deliana Avalos, [place], PT, [place] 06/10/2019, 9:14 PM  [facility] [address] [place], [place], [address] Phone: [phone]   Fax:  [phone]  Name: Taylor Fernandez MRN: [id number] Date of Birth: July 24, 1996

## 2019-06-19 ENCOUNTER — Ambulatory Visit: Payer: Medicaid Other | Attending: Family Medicine | Admitting: Physical Therapy

## 2019-06-19 ENCOUNTER — Other Ambulatory Visit: Payer: Self-pay

## 2019-06-19 DIAGNOSIS — R2681 Unsteadiness on feet: Secondary | ICD-10-CM

## 2019-06-19 DIAGNOSIS — M6281 Muscle weakness (generalized): Secondary | ICD-10-CM | POA: Diagnosis present

## 2019-06-19 DIAGNOSIS — R2689 Other abnormalities of gait and mobility: Secondary | ICD-10-CM

## 2019-06-20 NOTE — Therapy (Signed)
Physical Therapy Treatment  Patient Details  Name: JUNE VACHA MRN: [id number] Date of Birth: 08-26-1996 Referring Provider (PT): Dineen Kid, MD   Encounter Date: 06/19/2019  PT End of Session - 06/20/19 2149    Visit Number  4   no charge for 04/03/2019   Number of Visits  6    Date for PT Re-Evaluation  07/07/19    Authorization Type  Medicaid - will request 6-8 more visits for aquatic therapy    Authorization Time Period  05-27-19 - 07-07-19    Authorization - Visit Number  4    Authorization - Number of Visits  6    PT Start Time  1500    PT Stop Time  1545    PT Time Calculation (min)  45 min    Equipment Utilized During Treatment  --   water walker and pool noodle   Activity Tolerance  Patient tolerated treatment well    Behavior During Therapy  [facility] for tasks assessed/performed       Past Medical History:  Diagnosis Date  . Cerebral palsy ([place])   . Developmental delay   . Ranula of floor of mouth   . Wheelchair bound    Can not stand     Past Surgical History:  Procedure Laterality Date  . baclofen pump removal    . botox injections    . EYE MUSCLE SURGERY     23 yrs old and 21-10 yrs old  . KNEE SURGERY     Tendons extended on both legs  . SALIVARY STONE REMOVAL N/A 09/25/2018   Procedure: REMOVAL OF SALIVARY GLAND;  Surgeon: Diona Browner, DDS;  Location: [facility];  Service: Oral Surgery;  Laterality: N/A;  . STRABISMUS SURGERY Bilateral 04/03/2015   Procedure: REPAIR STRABISMUS PEDIATRIC;  Surgeon: Everitt Amber, MD;  Location: [place] [facility];  Service: Ophthalmology;  Laterality: Bilateral;    There were no vitals filed for this visit.  Subjective Assessment - 06/20/19 2148    Subjective  pt presents to [facility] for aquatic therapy - accompanied by his brother; pt reports no changes/problems today    Patient is accompained by:  Family member    Pertinent History  CP (Goes to [employer] 5 days per week 8-11am)    Patient Stated Goals   "To do more for myself."    Currently in Pain?  No/denies            Aquatic therapy - pool temp 87.2 degrees  Patient seen for aquatic therapy today.  Treatment took place in water 3.5-4 feet deep depending upon activity.  Pt entered and  exited the pool via wheelchair on wheelchair ramp (brother transferred pt from his wheelchair to pool wheelchair).   Flotation belt placed on pt for safety.  Pt ambulated in water with bil. UE support on water walker - 35macross pool x 2 reps with mod cues for weight shift prior to stepping with opposite LE  trialed use of water noodle for UE support  With mod assist from PTA given for stabilization with forward ambulation    Bad Ragaz technique for tone reduction and relaxation - noodle placed under arms with pt wearing flotation belt - pt assisted into supine position Gentle swaying and rocking side to side for tone reduction Hip abduction and adduction 10 reps each with assistance initially for increased ROM; bicycling LE's 10 reps; bil. Knees to chest with resistance for flexion  and extension  Pt performed standing hip abduction, flexion and extension 10 reps each leg with use of buoyancy for increased ROM and support for standing balance;    Pt requires buoyancy of water for support and for safety with gait training; viscosity of water needed for strengthening LE's;  Buoyancy of water needed for assist in achieving greater ROM than can be achieved on land and with less discomfort when performed in the water                         PT Short Term Goals - 06/20/19 2153      PT SHORT TERM GOAL #1   Title  =LTG's         PT Long Term Goals - 06/20/19 2154      PT LONG TERM GOAL #1   Title  Pt will demonstrate ability to transfers stand pivot from w/c with mother with minimal assistance    Baseline  min-mod A each direction    Time  6    Period  Weeks    Status  Revised      PT LONG TERM GOAL #2   Title  Patient's  mother will report that pt is ambulating up to 66-75' at home in posterior walker with minimal assistance    Baseline  25' with mod A    Time  6    Period  Weeks    Status  Revised      PT LONG TERM GOAL #4   Title  Pt/mother will be independent with final aquatic HEP in order to indicate improved functional mobility and decreased fall risk.    Baseline  unable to perform aquatic HEP due to COVID restrictions    Time  6    Period  Weeks    Status  Not Met            Plan - 06/20/19 2150    Clinical Impression Statement  Pt continues to requires mod assist with water walking with tactile cues for Lt hip adduction and Rt shoulder/upper trunk elevation for more upright posture.  Noodle was trialed but mod assist still needed for stabilization of noodle for balance and trunk control.    Rehab Potential  Good    Clinical Impairments Affecting Rehab Potential  severity of deficits    PT Frequency  1x / week    PT Duration  6 weeks    PT Treatment/Interventions  ADLs/Self Care Home Management;Aquatic Therapy;DME Instruction;Gait training;Stair training;Functional mobility training;Therapeutic activities;Therapeutic exercise;Balance training;Neuromuscular re-education;Patient/family education;Orthotic Fit/Training;Passive range of motion    PT Next Visit Plan  Restart aquatic therapy    PT Home Exercise Plan  aquatic HEP to be completed - instruction completed on 08-15-18 (need to give pics to pt and mother)     Consulted and Agree with Plan of Care  Patient;Family member/caregiver    Family Member Consulted  mother       Patient will benefit from skilled therapeutic intervention in order to improve the following deficits and impairments:  Abnormal gait, Decreased balance, Decreased coordination, Decreased endurance, Decreased knowledge of use of DME, Decreased mobility, Decreased range of motion, Decreased strength, Impaired perceived functional ability, Impaired flexibility, Impaired tone,  Impaired UE functional use, Postural dysfunction, Difficulty walking, Increased muscle spasms  Visit Diagnosis: Other abnormalities of gait and mobility  Muscle weakness (generalized)  Unsteadiness on feet     Problem List Patient Active Problem List   Diagnosis  Date Noted  . Reactive airway disease 11/09/2017  . 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 Congenital quadriplegia ([place]) 01/05/2012    Rindi Beechy, [place], PT, [facility] 06/20/2019, 9:58 PM  [facility] [address] [place], [place], [address] Phone: [phone]   Fax:  [phone]  Name: KAYDAN WONG MRN: [id number] Date of Birth: October 17, 1995

## 2019-06-24 ENCOUNTER — Ambulatory Visit: Payer: Medicaid Other | Admitting: Physical Therapy

## 2019-06-24 ENCOUNTER — Other Ambulatory Visit: Payer: Self-pay

## 2019-06-24 DIAGNOSIS — R2689 Other abnormalities of gait and mobility: Secondary | ICD-10-CM | POA: Diagnosis not present

## 2019-06-24 DIAGNOSIS — M6281 Muscle weakness (generalized): Secondary | ICD-10-CM

## 2019-06-24 DIAGNOSIS — R2681 Unsteadiness on feet: Secondary | ICD-10-CM

## 2019-06-25 NOTE — Therapy (Signed)
[facility] [address] [place], [place], [address] Phone: [phone]   Fax:  [phone]  Physical Therapy Treatment  Patient Details  Name: Taylor Fernandez MRN: [id number] Date of Birth: 03/25/1996 Referring Provider (PT): Kevin Via, MD   Encounter Date: 06/24/2019  PT End of Session - 06/25/19 2018    Visit Number  5   no charge for 04/03/2019   Number of Visits  6    Date for PT Re-Evaluation  07/07/19    Authorization Type  Medicaid - will request 6-8 more visits for aquatic therapy    Authorization Time Period  05-27-19 - 07-07-19    Authorization - Visit Number  5    Authorization - Number of Visits  6    PT Start Time  1505    PT Stop Time  1545    PT Time Calculation (min)  40 min    Equipment Utilized During Treatment  --   water walker and pool noodle   Activity Tolerance  Patient tolerated treatment well    Behavior During Therapy  [facility] for tasks assessed/performed       Past Medical History:  Diagnosis Date  . Cerebral palsy ([facility])   . Developmental delay   . Ranula of floor of mouth   . Wheelchair bound    Can not stand     Past Surgical History:  Procedure Laterality Date  . baclofen pump removal    . botox injections    . EYE MUSCLE SURGERY     23 yrs old and 21-21 yrs old  . KNEE SURGERY     Tendons extended on both legs  . SALIVARY STONE REMOVAL N/A 09/25/2018   Procedure: REMOVAL OF SALIVARY GLAND;  Surgeon: Jensen, Scott, DDS;  Location: [facility];  Service: Oral Surgery;  Laterality: N/A;  . STRABISMUS SURGERY Bilateral 04/03/2015   Procedure: REPAIR STRABISMUS PEDIATRIC;  Surgeon: William Young, MD;  Location: [facility];  Service: Ophthalmology;  Laterality: Bilateral;    There were no vitals filed for this visit.  Subjective Assessment - 06/25/19 2017    Subjective  pt presents to [facility] for aquatic therapy - accompanied by mother;  pt reports no changes/problems  today; mother asks if they can possibly get more visits approved from Medicaid    Patient is accompained by:  Family member    Pertinent History  CP (Goes to [employer] 5 days per week 8-11am)    Patient Stated Goals  "To do more for myself."    Currently in Pain?  No/denies            Aquatic therapy - pool temp 87.2 degrees  Patient seen for aquatic therapy today.  Treatment took place in water 3.5-4 feet deep depending upon activity.  Pt entered and  exited the pool via wheelchair on wheelchair ramp (mother transferred pt from his wheelchair to pool wheelchair).   Floatation belt placed on pt for safety.  Instructed pt and mother in HEP for continued participation in aquatic exercise at [employer] (23's mother's choice of community fitness facilities)   Pt ambulated in water initially with use of water walker 25m x 1 rep across pool:  Then with bil. UE support on mother's shoulders for support - approx. 40' forward; cues for weight shift prior to stepping with opposite LE  Mother instructed in Bad Ragaz technique for tone reduction and relaxation - noodle placed under arms with pt wearing flotation belt -   pt assisted into supine position Gentle swaying and rocking side to side for tone reduction Hip abduction and adduction 10 reps  for increased ROM; bil. Knees to chest with resistance for flexion and extension  Seated exercises on bench in pool - pt performed LAQ's 10 reps each leg with assistance for full AROM with stretching of hamstrings at end range; bil. Knee flexion 10 reps each with assistance for increased ROM; trunk extension/flexion for trunk strengthening 10 reps with CGA; trunk rotation in seated position 3 reps each to Rt and Lt sides  Pt requires buoyancy of water for support for balance and buoyancy of water to assist in achieving increased ROM with LE hip and knee exercises Pt requires viscosity of water for resistance with strengthening exercises                         PT Short Term Goals - 06/20/19 2153      PT SHORT TERM GOAL #1   Title  =LTG's         PT Long Term Goals - 06/25/19 2023      PT LONG TERM GOAL #1   Title  Pt will demonstrate ability to transfers stand pivot from w/c with mother with minimal assistance    Baseline  min-mod A each direction    Time  6    Period  Weeks    Status  Revised      PT LONG TERM GOAL #2   Title  Patient's mother will report that pt is ambulating up to 50-75' at home in posterior walker with minimal assistance    Baseline  25' with mod A    Time  6    Period  Weeks    Status  Revised      PT LONG TERM GOAL #4   Title  Pt/mother will be independent with final aquatic HEP in order to indicate improved functional mobility and decreased fall risk.    Baseline  unable to perform aquatic HEP due to COVID restrictions    Time  6    Period  Weeks    Status  Not Met            Plan - 06/25/19 2019    Clinical Impression Statement  Pt continues to require cues for upright posture due to forward lean with lateral trunk flexion to Rt side with Lt hip abducted;  pt's posture improved during aquatic therapy session today after initially using water walker for assistance with gait 25m across pool.  Pt has limited active knee flexion and extension bil. LE's due to spasticity.    Rehab Potential  Good    Clinical Impairments Affecting Rehab Potential  severity of deficits    PT Frequency  1x / week    PT Duration  6 weeks    PT Treatment/Interventions  ADLs/Self Care Home Management;Aquatic Therapy;DME Instruction;Gait training;Stair training;Functional mobility training;Therapeutic activities;Therapeutic exercise;Balance training;Neuromuscular re-education;Patient/family education;Orthotic Fit/Training;Passive range of motion    PT Next Visit Plan  Restart aquatic therapy    PT Home Exercise Plan  aquatic HEP to be completed - instruction completed on 08-15-18 (need  to give pics to pt and mother)     Consulted and Agree with Plan of Care  Patient;Family member/caregiver    Family Member Consulted  mother       Patient will benefit from skilled therapeutic intervention in order to improve the following deficits and impairments:  Abnormal gait,   Decreased balance, Decreased coordination, Decreased endurance, Decreased knowledge of use of DME, Decreased mobility, Decreased range of motion, Decreased strength, Impaired perceived functional ability, Impaired flexibility, Impaired tone, Impaired UE functional use, Postural dysfunction, Difficulty walking, Increased muscle spasms  Visit Diagnosis: Other abnormalities of gait and mobility  Muscle weakness (generalized)  Unsteadiness on feet     Problem List Patient Active Problem List   Diagnosis Date Noted  . Reactive airway disease 11/09/2017  . 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 Congenital quadriplegia ([facility]) 01/05/2012    Dilday, Linda Suzanne, PT, [facility] 06/25/2019, 8:27 PM  [facility] [address] [place], [place], [address] Phone: [phone]   Fax:  [phone]  Name: Rune A Duman MRN: [id number] Date of Birth: 03/26/1996   

## 2019-07-01 ENCOUNTER — Ambulatory Visit: Payer: Medicaid Other | Admitting: Physical Therapy

## 2019-12-27 ENCOUNTER — Ambulatory Visit: Payer: Medicaid Other | Attending: Internal Medicine

## 2019-12-27 DIAGNOSIS — Z23 Encounter for immunization: Secondary | ICD-10-CM

## 2019-12-27 NOTE — Progress Notes (Signed)
   Covid-19 Vaccination Clinic  Name:  TRAJAN MANGELS    MRN: [id number] DOB: December 04, 1995  12/27/2019  Mr. Koeppen was observed post Covid-19 immunization for 15 minutes without incident. He was provided with Vaccine Information Sheet and instruction to access the V-Safe system.   Mr. Bosio was instructed to call 911 with any severe reactions post vaccine: Marland Kitchen Difficulty breathing  . Swelling of face and throat  . A fast heartbeat  . A bad rash all over body  . Dizziness and weakness   Immunizations Administered    Name Date Dose VIS Date Route   Pfizer COVID-19 Vaccine 12/27/2019  9:57 AM 0.3 mL 09/20/2019 Intramuscular   Manufacturer: [place]   Lot: [id number]   [place]: [id number]

## 2020-01-21 ENCOUNTER — Ambulatory Visit: Payer: Medicaid Other | Attending: Internal Medicine

## 2020-01-21 DIAGNOSIS — Z23 Encounter for immunization: Secondary | ICD-10-CM

## 2020-01-21 NOTE — Progress Notes (Signed)
   Covid-19 Vaccination Clinic  Name:  Taylor Fernandez    MRN: [id number] DOB: [birth year]  01/21/2020  Mr. [name] was observed post Covid-19 immunization for 15 minutes without incident. He was provided with Vaccine Information Sheet and instruction to access the V-Safe system.   Mr. [name] was instructed to call 911 with any severe reactions post vaccine: Marland Kitchen Difficulty breathing  . Swelling of face and throat  . A fast heartbeat  . A bad rash all over body  . Dizziness and weakness   Immunizations Administered    Name Date Dose VIS Date Route   Pfizer COVID-19 Vaccine 01/21/2020 10:56 AM 0.3 mL 09/20/2019 Intramuscular   Manufacturer: [place]   Lot: [id number]   [place]: [id number]

## 2020-08-17 IMAGING — CT CT NECK W/ CM
2 of 3 series · 7 of 14 positions shown, 8 images · IV contrast (iopamidol)
Comparison: None.

CLINICAL DATA: Swollen glands under the tongue for 3-4 months

EXAM:
CT NECK WITH CONTRAST
TECHNIQUE: Multidetector CT imaging of the neck was performed using the
standard protocol following the bolus administration of intravenous
contrast.
CONTRAST:  75mL ZNEHOB-RQQ IOPAMIDOL (ZNEHOB-RQQ) INJECTION 61%

[Series 3: neck · axial · 0.64mm/px · z∈[-257,-137]mm · 3 of 122 slices shown]
[im 31/122  bone]
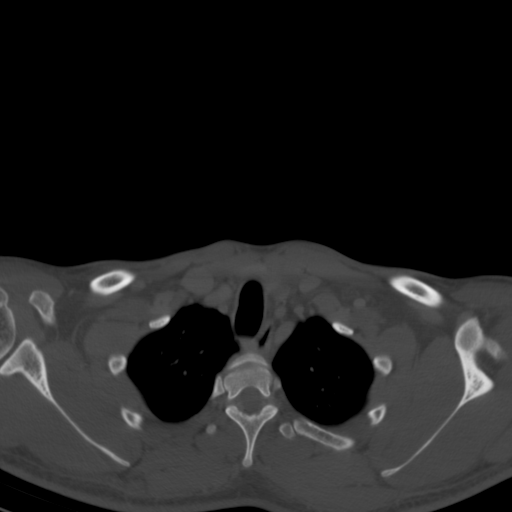
[im 61/122  bone]
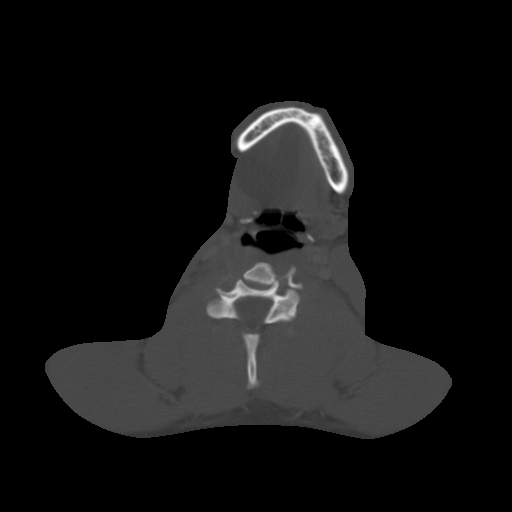
[im 91/122  bone]
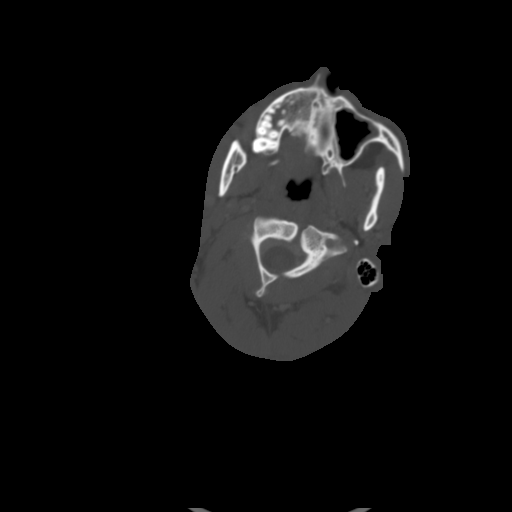

[Series 6: angled axial-oropharynx · axial · 0.55mm/px · z∈[-329,-186]mm · 4 of 130 slices shown, 5 images]
[im 26/130  soft-tissue]
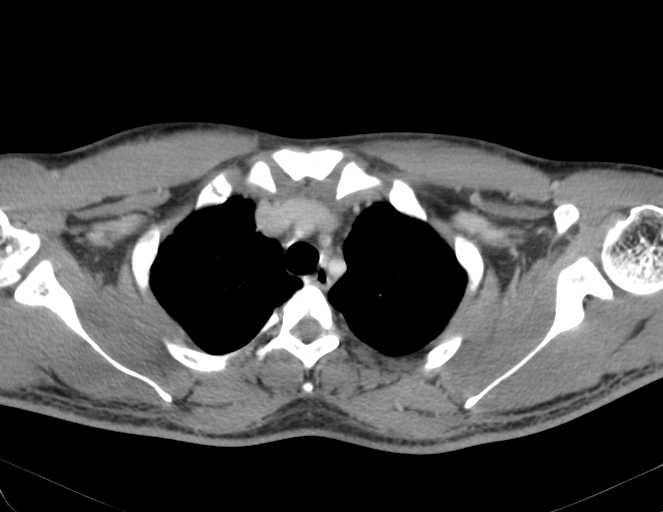
[im 26/130  bone]
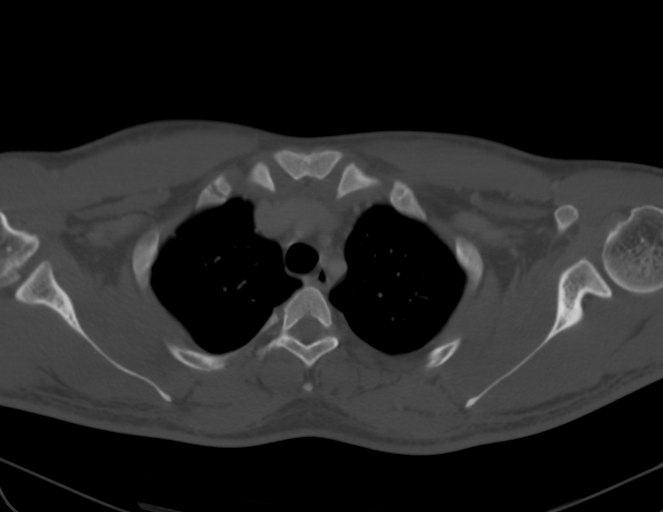
[im 52/130  bone]
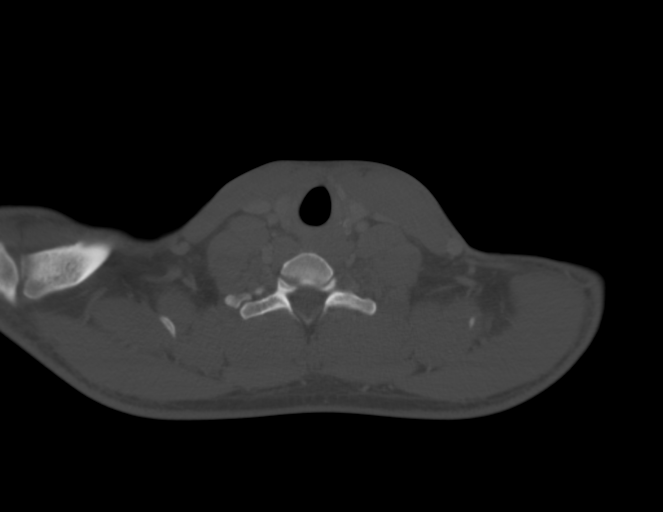
[im 78/130  bone]
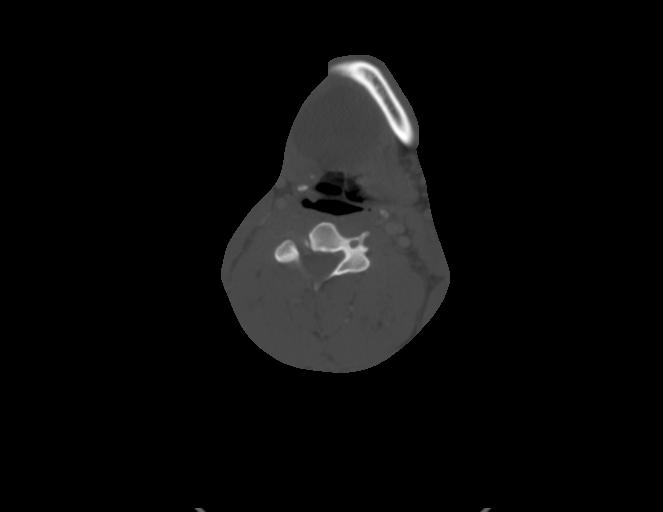
[im 104/130  bone]
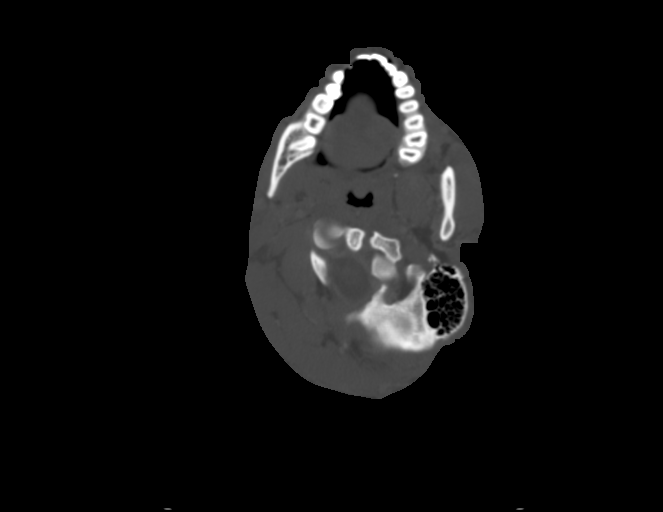

[7 of 14 positions shown; findings below may reference images not displayed]

FINDINGS: Pharynx and larynx: No mucosal based mass or swelling.

There is a sublingual midline mass with upward projection into the
oral cavity, posteriorly displacing the tongue. Mass measures 8 x 5
x 4 cm in maximum. There is no evident inflammatory enhancement or
internal complexity. The bilateral submandibular ducts are dilated
as a past superior to the mass. A ranula is favored. If this were a
congenital dermoid or lymphangioma would expect earlier
presentation. There is greater anterior and superior extent than
seen with thyroglossal duct cyst.

Salivary glands: As above.  Negative parotids.

Thyroid: Negative

Lymph nodes: None enlarged or abnormal density.

Vascular: Negative

Limited intracranial: Negative

Visualized orbits: Negative

Mastoids and visualized paranasal sinuses: Patchy mucosal thickening
throughout the paranasal sinuses.

Skeleton: Negative

Upper chest: Clear apical lungs
IMPRESSION: 8 x 4 x 6 cm mass in the sublingual space with prominent posterior
displacement of the tongue. As above, a ranula is most likely.

## 2020-10-17 ENCOUNTER — Ambulatory Visit: Payer: Medicaid Other | Attending: Internal Medicine

## 2020-10-17 DIAGNOSIS — Z23 Encounter for immunization: Secondary | ICD-10-CM

## 2020-10-17 NOTE — Progress Notes (Signed)
   Covid-19 Vaccination Clinic  Name:  Taylor Fernandez    MRN: [id number] DOB: 1995/11/02  10/17/2020  [name] was observed post Covid-19 immunization for 15 minutes without incident. He was provided with Vaccine Information Sheet and instruction to access the V-Safe system.   [name] was instructed to call 911 with any severe reactions post vaccine: Marland Kitchen Difficulty breathing  . Swelling of face and throat  . A fast heartbeat  . A bad rash all over body  . Dizziness and weakness   Immunizations Administered    Name Date Dose VIS Date Route   Moderna Covid-19 Booster Vaccine 10/17/2020 10:10 AM 0.25 mL 07/29/2020 Intramuscular   Manufacturer: Moderna   Lot: [id number]   [place]: [id number]

## 2021-02-19 ENCOUNTER — Ambulatory Visit (INDEPENDENT_AMBULATORY_CARE_PROVIDER_SITE_OTHER): Payer: Medicaid Other | Admitting: Podiatry

## 2021-02-19 ENCOUNTER — Other Ambulatory Visit: Payer: Self-pay

## 2021-02-19 DIAGNOSIS — M2011 Hallux valgus (acquired), right foot: Secondary | ICD-10-CM | POA: Diagnosis not present

## 2021-02-19 DIAGNOSIS — H509 Unspecified strabismus: Secondary | ICD-10-CM | POA: Insufficient documentation

## 2021-02-19 DIAGNOSIS — J309 Allergic rhinitis, unspecified: Secondary | ICD-10-CM | POA: Insufficient documentation

## 2021-02-19 DIAGNOSIS — L84 Corns and callosities: Secondary | ICD-10-CM | POA: Insufficient documentation

## 2021-02-19 DIAGNOSIS — E559 Vitamin D deficiency, unspecified: Secondary | ICD-10-CM | POA: Insufficient documentation

## 2021-02-19 DIAGNOSIS — M2450 Contracture, unspecified joint: Secondary | ICD-10-CM | POA: Insufficient documentation

## 2021-02-19 DIAGNOSIS — K5900 Constipation, unspecified: Secondary | ICD-10-CM | POA: Insufficient documentation

## 2021-02-19 DIAGNOSIS — G809 Cerebral palsy, unspecified: Secondary | ICD-10-CM | POA: Insufficient documentation

## 2021-02-19 DIAGNOSIS — H5010 Unspecified exotropia: Secondary | ICD-10-CM | POA: Insufficient documentation

## 2021-02-19 NOTE — Progress Notes (Signed)
  Subjective:  Patient ID: Taylor Fernandez, male    DOB: [birth year],  MRN: [id number]  Chief Complaint  Patient presents with  . Callouses    Pt states callous is non-painful but has some swelling around the digit and underneath.   25 y.o. male presents with the above complaint. History confirmed with patient.   Objective:  Physical Exam: warm, good capillary refill, no trophic changes or ulcerative lesions, normal DP and PT pulses and normal sensory exam.  Right Foot: Hallux interphalangeus mild malleus   Assessment:   1. Hallux interphalangeus, acquired, right    Plan:  Patient was evaluated and treated and all questions answered.  Hallux Malleus right -Educated on etiology -Debridement of callus courtesy. -Could consider surgery if more symptomatic. -F/u should issues persist.  Return if symptoms worsen or fail to improve.
